# Patient Record
Sex: Male | Born: 1935 | Race: White | Hispanic: No | Marital: Married | Smoking: Never smoker
Health system: Southern US, Community
[De-identification: ages and names within clinical notes are randomized; demographics above are authoritative.]

## PROBLEM LIST (undated history)

## (undated) DIAGNOSIS — E785 Hyperlipidemia, unspecified: Secondary | ICD-10-CM

## (undated) DIAGNOSIS — G20A1 Parkinson's disease without dyskinesia, without mention of fluctuations: Secondary | ICD-10-CM

## (undated) DIAGNOSIS — W19XXXA Unspecified fall, initial encounter: Secondary | ICD-10-CM

## (undated) DIAGNOSIS — C189 Malignant neoplasm of colon, unspecified: Secondary | ICD-10-CM

## (undated) DIAGNOSIS — R5383 Other fatigue: Secondary | ICD-10-CM

## (undated) DIAGNOSIS — F329 Major depressive disorder, single episode, unspecified: Secondary | ICD-10-CM

## (undated) DIAGNOSIS — R739 Hyperglycemia, unspecified: Secondary | ICD-10-CM

## (undated) DIAGNOSIS — G20 Parkinson's disease: Secondary | ICD-10-CM

## (undated) DIAGNOSIS — F32A Depression, unspecified: Secondary | ICD-10-CM

## (undated) DIAGNOSIS — F1010 Alcohol abuse, uncomplicated: Secondary | ICD-10-CM

## (undated) DIAGNOSIS — F0390 Unspecified dementia without behavioral disturbance: Secondary | ICD-10-CM

## (undated) HISTORY — DX: Parkinson's disease: G20

## (undated) HISTORY — DX: Unspecified dementia, unspecified severity, without behavioral disturbance, psychotic disturbance, mood disturbance, and anxiety: F03.90

## (undated) HISTORY — DX: Major depressive disorder, single episode, unspecified: F32.9

## (undated) HISTORY — DX: Other fatigue: R53.83

## (undated) HISTORY — DX: Depression, unspecified: F32.A

## (undated) HISTORY — PX: SPINAL FUSION: SHX223

## (undated) HISTORY — DX: Hyperlipidemia, unspecified: E78.5

## (undated) HISTORY — DX: Malignant neoplasm of colon, unspecified: C18.9

## (undated) HISTORY — PX: COLECTOMY: SHX59

## (undated) HISTORY — DX: Parkinson's disease without dyskinesia, without mention of fluctuations: G20.A1

## (undated) HISTORY — DX: Alcohol abuse, uncomplicated: F10.10

---

## 1998-08-01 ENCOUNTER — Inpatient Hospital Stay (HOSPITAL_COMMUNITY): Admission: EM | Admit: 1998-08-01 | Discharge: 1998-08-10 | Payer: Self-pay | Admitting: Emergency Medicine

## 1999-08-17 ENCOUNTER — Ambulatory Visit (HOSPITAL_BASED_OUTPATIENT_CLINIC_OR_DEPARTMENT_OTHER): Admission: RE | Admit: 1999-08-17 | Discharge: 1999-08-17 | Payer: Self-pay | Admitting: Orthopedic Surgery

## 2000-04-23 ENCOUNTER — Other Ambulatory Visit: Admission: RE | Admit: 2000-04-23 | Discharge: 2000-04-23 | Payer: Self-pay | Admitting: Gastroenterology

## 2000-04-23 ENCOUNTER — Encounter (INDEPENDENT_AMBULATORY_CARE_PROVIDER_SITE_OTHER): Payer: Self-pay | Admitting: Specialist

## 2001-07-01 ENCOUNTER — Encounter (INDEPENDENT_AMBULATORY_CARE_PROVIDER_SITE_OTHER): Payer: Self-pay | Admitting: Specialist

## 2001-07-01 ENCOUNTER — Other Ambulatory Visit: Admission: RE | Admit: 2001-07-01 | Discharge: 2001-07-01 | Payer: Self-pay | Admitting: Gastroenterology

## 2005-05-16 ENCOUNTER — Ambulatory Visit: Payer: Self-pay | Admitting: Internal Medicine

## 2005-08-16 ENCOUNTER — Ambulatory Visit: Payer: Self-pay | Admitting: Internal Medicine

## 2005-09-01 ENCOUNTER — Ambulatory Visit: Payer: Self-pay | Admitting: Internal Medicine

## 2005-09-06 ENCOUNTER — Encounter: Payer: Self-pay | Admitting: Internal Medicine

## 2005-09-19 ENCOUNTER — Encounter: Payer: Self-pay | Admitting: Internal Medicine

## 2005-10-13 ENCOUNTER — Encounter: Admission: RE | Admit: 2005-10-13 | Discharge: 2005-10-13 | Payer: Self-pay | Admitting: Neurology

## 2005-11-29 ENCOUNTER — Inpatient Hospital Stay (HOSPITAL_COMMUNITY): Admission: RE | Admit: 2005-11-29 | Discharge: 2005-11-30 | Payer: Self-pay | Admitting: Neurosurgery

## 2006-12-13 ENCOUNTER — Ambulatory Visit: Payer: Self-pay | Admitting: Internal Medicine

## 2006-12-13 LAB — CONVERTED CEMR LAB
ALT: 20 units/L (ref 0–40)
AST: 23 units/L (ref 0–37)
BUN: 16 mg/dL (ref 6–23)
Calcium: 9.5 mg/dL (ref 8.4–10.5)
Chol/HDL Ratio, serum: 6.2
Cholesterol: 223 mg/dL (ref 0–200)
Creatinine, Ser: 0.9 mg/dL (ref 0.4–1.5)
Glucose, Bld: 96 mg/dL (ref 70–99)
HCT: 45.6 % (ref 39.0–52.0)
HDL: 35.8 mg/dL — ABNORMAL LOW (ref >39.0)
Hemoglobin: 15.4 g/dL (ref 13.0–17.0)
LDL DIRECT: 180.3 mg/dL
MCHC: 33.8 g/dL (ref 30.0–36.0)
MCV: 89 fL (ref 78.0–100.0)
PSA: 1.38 ng/mL (ref 0.10–4.00)
Platelets: 157 10*3/uL (ref 150–400)
Potassium: 3.9 meq/L (ref 3.5–5.1)
RBC: 5.13 M/uL (ref 4.22–5.81)
RDW: 14 % (ref 11.5–14.6)
TSH: 1.78 microintl units/mL (ref 0.35–5.50)
Triglyceride fasting, serum: 96 mg/dL (ref 0–149)
VLDL: 19 mg/dL (ref 0–40)
WBC: 5.8 10*3/uL (ref 4.5–10.5)

## 2007-02-08 ENCOUNTER — Ambulatory Visit: Payer: Self-pay | Admitting: Internal Medicine

## 2007-02-13 ENCOUNTER — Ambulatory Visit: Payer: Self-pay | Admitting: Internal Medicine

## 2007-02-13 LAB — CONVERTED CEMR LAB
ALT: 16 units/L (ref 0–40)
AST: 22 units/L (ref 0–37)
Cholesterol: 159 mg/dL (ref 0–200)
HDL: 37.2 mg/dL — ABNORMAL LOW (ref >39.0)
LDL Cholesterol: 107 mg/dL — ABNORMAL HIGH (ref 0–99)
Total CHOL/HDL Ratio: 4.3
Triglycerides: 74 mg/dL (ref 0–149)
VLDL: 15 mg/dL (ref 0–40)

## 2007-05-31 ENCOUNTER — Ambulatory Visit: Payer: Self-pay | Admitting: Internal Medicine

## 2007-05-31 DIAGNOSIS — R5381 Other malaise: Secondary | ICD-10-CM | POA: Insufficient documentation

## 2007-05-31 DIAGNOSIS — R5383 Other fatigue: Secondary | ICD-10-CM

## 2007-05-31 DIAGNOSIS — C189 Malignant neoplasm of colon, unspecified: Secondary | ICD-10-CM | POA: Insufficient documentation

## 2007-05-31 DIAGNOSIS — E785 Hyperlipidemia, unspecified: Secondary | ICD-10-CM

## 2007-06-03 ENCOUNTER — Encounter: Admission: RE | Admit: 2007-06-03 | Discharge: 2007-06-03 | Payer: Self-pay | Admitting: Internal Medicine

## 2007-06-03 ENCOUNTER — Ambulatory Visit: Payer: Self-pay | Admitting: Internal Medicine

## 2007-06-03 LAB — CONVERTED CEMR LAB
ALT: 16 units/L (ref 0–40)
AST: 23 units/L (ref 0–37)
BUN: 15 mg/dL (ref 6–23)
Basophils Absolute: 0 10*3/uL (ref 0.0–0.1)
Basophils Relative: 0.1 % (ref 0.0–1.0)
CO2: 27 meq/L (ref 19–32)
Calcium: 9.4 mg/dL (ref 8.4–10.5)
Chloride: 108 meq/L (ref 96–112)
Creatinine, Ser: 0.8 mg/dL (ref 0.4–1.5)
Eosinophils Absolute: 0 10*3/uL (ref 0.0–0.6)
Eosinophils Relative: 0.8 % (ref 0.0–5.0)
GFR calc Af Amer: 120 mL/min
GFR calc non Af Amer: 99 mL/min
Glucose, Bld: 117 mg/dL — ABNORMAL HIGH (ref 70–99)
HCT: 43.3 % (ref 39.0–52.0)
Hemoglobin: 14.9 g/dL (ref 13.0–17.0)
Lymphocytes Relative: 21.5 % (ref 12.0–46.0)
MCHC: 34.3 g/dL (ref 30.0–36.0)
MCV: 88.4 fL (ref 78.0–100.0)
Monocytes Absolute: 0.5 10*3/uL (ref 0.2–0.7)
Monocytes Relative: 7.9 % (ref 3.0–11.0)
Neutro Abs: 4.2 10*3/uL (ref 1.4–7.7)
Neutrophils Relative %: 69.7 % (ref 43.0–77.0)
Platelets: 163 10*3/uL (ref 150–400)
Potassium: 3.8 meq/L (ref 3.5–5.1)
RBC: 4.9 M/uL (ref 4.22–5.81)
RDW: 13.7 % (ref 11.5–14.6)
Sodium: 141 meq/L (ref 135–145)
WBC: 6 10*3/uL (ref 4.5–10.5)

## 2007-06-05 ENCOUNTER — Encounter: Payer: Self-pay | Admitting: Internal Medicine

## 2007-11-28 ENCOUNTER — Ambulatory Visit: Payer: Self-pay | Admitting: Internal Medicine

## 2008-02-23 ENCOUNTER — Encounter: Payer: Self-pay | Admitting: Internal Medicine

## 2008-03-10 ENCOUNTER — Ambulatory Visit: Payer: Self-pay | Admitting: Internal Medicine

## 2008-03-10 DIAGNOSIS — R269 Unspecified abnormalities of gait and mobility: Secondary | ICD-10-CM | POA: Insufficient documentation

## 2008-03-10 DIAGNOSIS — R413 Other amnesia: Secondary | ICD-10-CM | POA: Insufficient documentation

## 2008-03-19 LAB — CONVERTED CEMR LAB
ALT: 18 units/L (ref 0–53)
AST: 20 units/L (ref 0–37)
BUN: 17 mg/dL (ref 6–23)
Basophils Absolute: 0 10*3/uL (ref 0.0–0.1)
Basophils Relative: 0.3 % (ref 0.0–1.0)
CO2: 31 meq/L (ref 19–32)
Calcium: 9.7 mg/dL (ref 8.4–10.5)
Chloride: 106 meq/L (ref 96–112)
Cholesterol: 133 mg/dL (ref 0–200)
Creatinine, Ser: 0.9 mg/dL (ref 0.4–1.5)
Eosinophils Absolute: 0.1 10*3/uL (ref 0.0–0.6)
Eosinophils Relative: 0.9 % (ref 0.0–5.0)
GFR calc Af Amer: 105 mL/min
GFR calc non Af Amer: 87 mL/min
Glucose, Bld: 94 mg/dL (ref 70–99)
HCT: 43.7 % (ref 39.0–52.0)
HDL: 38.5 mg/dL — ABNORMAL LOW (ref >39.0)
Hemoglobin: 14.8 g/dL (ref 13.0–17.0)
Iron: 54 ug/dL (ref 42–165)
LDL Cholesterol: 78 mg/dL (ref 0–99)
Lymphocytes Relative: 15.3 % (ref 12.0–46.0)
MCHC: 33.9 g/dL (ref 30.0–36.0)
MCV: 88.1 fL (ref 78.0–100.0)
Monocytes Absolute: 0.7 10*3/uL (ref 0.2–0.7)
Monocytes Relative: 9.2 % (ref 3.0–11.0)
Neutro Abs: 5.3 10*3/uL (ref 1.4–7.7)
Neutrophils Relative %: 74.3 % (ref 43.0–77.0)
Platelets: 141 10*3/uL — ABNORMAL LOW (ref 150–400)
Potassium: 4.1 meq/L (ref 3.5–5.1)
RBC: 4.96 M/uL (ref 4.22–5.81)
RDW: 14.1 % (ref 11.5–14.6)
Saturation Ratios: 18 % — ABNORMAL LOW (ref 20.0–50.0)
Sodium: 141 meq/L (ref 135–145)
TSH: 1.81 microintl units/mL (ref 0.35–5.50)
Total CHOL/HDL Ratio: 3.5
Total CK: 84 units/L (ref 7–195)
Transferrin: 214.2 mg/dL (ref >=212.0)
Triglycerides: 82 mg/dL (ref 0–149)
VLDL: 16 mg/dL (ref 0–40)
WBC: 7.2 10*3/uL (ref 4.5–10.5)

## 2008-03-23 ENCOUNTER — Encounter: Admission: RE | Admit: 2008-03-23 | Discharge: 2008-06-02 | Payer: Self-pay | Admitting: Internal Medicine

## 2008-03-23 ENCOUNTER — Encounter: Payer: Self-pay | Admitting: Internal Medicine

## 2008-04-24 ENCOUNTER — Encounter: Payer: Self-pay | Admitting: Internal Medicine

## 2008-05-08 ENCOUNTER — Ambulatory Visit: Payer: Self-pay | Admitting: Internal Medicine

## 2008-06-03 ENCOUNTER — Encounter: Payer: Self-pay | Admitting: Internal Medicine

## 2008-07-06 ENCOUNTER — Ambulatory Visit: Payer: Self-pay | Admitting: Internal Medicine

## 2008-07-06 LAB — CONVERTED CEMR LAB: Vit D, 1,25-Dihydroxy: 22 — ABNORMAL LOW (ref 30–89)

## 2008-07-09 ENCOUNTER — Telehealth (INDEPENDENT_AMBULATORY_CARE_PROVIDER_SITE_OTHER): Payer: Self-pay | Admitting: *Deleted

## 2008-07-09 LAB — CONVERTED CEMR LAB
Folate: >20 ng/mL
Sed Rate: 11 mm/hr (ref 0–16)
Total CK: 71 units/L (ref 7–195)
Vitamin B-12: 443 pg/mL (ref 211–911)

## 2008-07-10 ENCOUNTER — Ambulatory Visit: Payer: Self-pay | Admitting: Internal Medicine

## 2008-07-13 ENCOUNTER — Encounter (INDEPENDENT_AMBULATORY_CARE_PROVIDER_SITE_OTHER): Payer: Self-pay | Admitting: *Deleted

## 2008-07-16 ENCOUNTER — Ambulatory Visit: Payer: Self-pay

## 2008-07-16 ENCOUNTER — Encounter: Payer: Self-pay | Admitting: Internal Medicine

## 2008-07-28 ENCOUNTER — Telehealth (INDEPENDENT_AMBULATORY_CARE_PROVIDER_SITE_OTHER): Payer: Self-pay | Admitting: *Deleted

## 2008-08-10 ENCOUNTER — Encounter (INDEPENDENT_AMBULATORY_CARE_PROVIDER_SITE_OTHER): Payer: Self-pay | Admitting: *Deleted

## 2008-08-18 ENCOUNTER — Encounter: Payer: Self-pay | Admitting: Internal Medicine

## 2008-08-21 ENCOUNTER — Encounter: Payer: Self-pay | Admitting: Internal Medicine

## 2008-09-02 ENCOUNTER — Encounter: Payer: Self-pay | Admitting: Internal Medicine

## 2009-03-12 ENCOUNTER — Ambulatory Visit: Payer: Self-pay | Admitting: Internal Medicine

## 2009-03-12 DIAGNOSIS — I359 Nonrheumatic aortic valve disorder, unspecified: Secondary | ICD-10-CM | POA: Insufficient documentation

## 2009-03-16 ENCOUNTER — Encounter (INDEPENDENT_AMBULATORY_CARE_PROVIDER_SITE_OTHER): Payer: Self-pay | Admitting: *Deleted

## 2009-03-16 LAB — CONVERTED CEMR LAB
ALT: 16 units/L (ref 0–53)
AST: 22 units/L (ref 0–37)
Amylase: 58 units/L (ref 27–131)
BUN: 17 mg/dL (ref 6–23)
Basophils Absolute: 0 10*3/uL (ref 0.0–0.1)
Basophils Relative: 0.2 % (ref 0.0–3.0)
CO2: 29 meq/L (ref 19–32)
Calcium: 9.2 mg/dL (ref 8.4–10.5)
Chloride: 104 meq/L (ref 96–112)
Creatinine, Ser: 0.8 mg/dL (ref 0.4–1.5)
Eosinophils Absolute: 0 10*3/uL (ref 0.0–0.7)
Eosinophils Relative: 0.8 % (ref 0.0–5.0)
GFR calc non Af Amer: 98.81 mL/min (ref >60)
Glucose, Bld: 94 mg/dL (ref 70–99)
HCT: 42.1 % (ref 39.0–52.0)
Hemoglobin: 14.8 g/dL (ref 13.0–17.0)
Lipase: 23 units/L (ref 11.0–59.0)
Lymphocytes Relative: 15.6 % (ref 12.0–46.0)
Lymphs Abs: 1 10*3/uL (ref 0.7–4.0)
MCHC: 35 g/dL (ref 30.0–36.0)
MCV: 88.1 fL (ref 78.0–100.0)
Monocytes Absolute: 0.6 10*3/uL (ref 0.1–1.0)
Monocytes Relative: 9.5 % (ref 3.0–12.0)
Neutro Abs: 4.6 10*3/uL (ref 1.4–7.7)
Neutrophils Relative %: 73.9 % (ref 43.0–77.0)
Platelets: 132 10*3/uL — ABNORMAL LOW (ref 150.0–400.0)
Potassium: 4 meq/L (ref 3.5–5.1)
RBC: 4.78 M/uL (ref 4.22–5.81)
RDW: 13.9 % (ref 11.5–14.6)
Sodium: 139 meq/L (ref 135–145)
WBC: 6.2 10*3/uL (ref 4.5–10.5)

## 2009-03-31 ENCOUNTER — Encounter (INDEPENDENT_AMBULATORY_CARE_PROVIDER_SITE_OTHER): Payer: Self-pay | Admitting: *Deleted

## 2009-06-22 ENCOUNTER — Telehealth: Payer: Self-pay | Admitting: Internal Medicine

## 2009-06-22 ENCOUNTER — Encounter (INDEPENDENT_AMBULATORY_CARE_PROVIDER_SITE_OTHER): Payer: Self-pay | Admitting: *Deleted

## 2009-06-22 ENCOUNTER — Ambulatory Visit: Payer: Self-pay | Admitting: Internal Medicine

## 2009-06-25 LAB — CONVERTED CEMR LAB
Basophils Absolute: 0 10*3/uL (ref 0.0–0.1)
Basophils Relative: 0.2 % (ref 0.0–3.0)
Eosinophils Absolute: 0.1 10*3/uL (ref 0.0–0.7)
Eosinophils Relative: 0.8 % (ref 0.0–5.0)
HCT: 43.8 % (ref 39.0–52.0)
Hemoglobin: 15 g/dL (ref 13.0–17.0)
Lymphocytes Relative: 14.7 % (ref 12.0–46.0)
Lymphs Abs: 1 10*3/uL (ref 0.7–4.0)
MCHC: 34.3 g/dL (ref 30.0–36.0)
MCV: 89.7 fL (ref 78.0–100.0)
Monocytes Absolute: 0.6 10*3/uL (ref 0.1–1.0)
Monocytes Relative: 9.1 % (ref 3.0–12.0)
Neutro Abs: 4.8 10*3/uL (ref 1.4–7.7)
Neutrophils Relative %: 75.2 % (ref 43.0–77.0)
Platelets: 134 10*3/uL — ABNORMAL LOW (ref 150.0–400.0)
RBC: 4.88 M/uL (ref 4.22–5.81)
RDW: 13.9 % (ref 11.5–14.6)
TSH: 1.23 microintl units/mL (ref 0.35–5.50)
WBC: 6.5 10*3/uL (ref 4.5–10.5)

## 2009-07-05 ENCOUNTER — Encounter (INDEPENDENT_AMBULATORY_CARE_PROVIDER_SITE_OTHER): Payer: Self-pay | Admitting: *Deleted

## 2009-07-05 ENCOUNTER — Telehealth: Payer: Self-pay | Admitting: Internal Medicine

## 2009-10-21 ENCOUNTER — Ambulatory Visit: Payer: Self-pay | Admitting: Internal Medicine

## 2009-10-22 LAB — CONVERTED CEMR LAB
ALT: 8 units/L (ref 0–53)
AST: 15 units/L (ref 0–37)
Cholesterol: 146 mg/dL (ref 0–200)
HDL: 40.4 mg/dL (ref >39.00)
Hemoglobin: 14.6 g/dL (ref 13.0–17.0)
LDL Cholesterol: 88 mg/dL (ref 0–99)
PSA: 1.11 ng/mL (ref 0.10–4.00)
Total CHOL/HDL Ratio: 4
Triglycerides: 90 mg/dL (ref 0.0–149.0)
VLDL: 18 mg/dL (ref 0.0–40.0)

## 2010-01-24 ENCOUNTER — Encounter: Payer: Self-pay | Admitting: Internal Medicine

## 2010-02-21 ENCOUNTER — Encounter: Payer: Self-pay | Admitting: Internal Medicine

## 2010-02-24 ENCOUNTER — Telehealth: Payer: Self-pay | Admitting: Gastroenterology

## 2010-03-21 ENCOUNTER — Encounter: Payer: Self-pay | Admitting: Internal Medicine

## 2010-03-24 ENCOUNTER — Ambulatory Visit: Payer: Self-pay | Admitting: Internal Medicine

## 2010-03-24 DIAGNOSIS — F329 Major depressive disorder, single episode, unspecified: Secondary | ICD-10-CM | POA: Insufficient documentation

## 2010-03-24 DIAGNOSIS — G20A1 Parkinson's disease without dyskinesia, without mention of fluctuations: Secondary | ICD-10-CM | POA: Insufficient documentation

## 2010-03-24 DIAGNOSIS — G20 Parkinson's disease: Secondary | ICD-10-CM | POA: Insufficient documentation

## 2010-03-25 ENCOUNTER — Encounter (INDEPENDENT_AMBULATORY_CARE_PROVIDER_SITE_OTHER): Payer: Self-pay | Admitting: *Deleted

## 2010-03-25 HISTORY — PX: SHOULDER ARTHROSCOPY: SHX128

## 2010-03-26 LAB — CONVERTED CEMR LAB
ALT: 13 units/L (ref 0–53)
AST: 21 units/L (ref 0–37)
Albumin: 4.3 g/dL (ref 3.5–5.2)
Alkaline Phosphatase: 74 units/L (ref 39–117)
BUN: 11 mg/dL (ref 6–23)
Bilirubin, Direct: 0.2 mg/dL (ref 0.0–0.3)
CO2: 28 meq/L (ref 19–32)
Calcium: 9.4 mg/dL (ref 8.4–10.5)
Chloride: 98 meq/L (ref 96–112)
Creatinine, Ser: 0.9 mg/dL (ref 0.4–1.5)
GFR calc non Af Amer: 86.03 mL/min (ref >60)
Glucose, Bld: 99 mg/dL (ref 70–99)
Potassium: 4.5 meq/L (ref 3.5–5.1)
Sodium: 136 meq/L (ref 135–145)
Total Bilirubin: 1.3 mg/dL — ABNORMAL HIGH (ref 0.3–1.2)
Total Protein: 7.2 g/dL (ref 6.0–8.3)

## 2010-03-28 ENCOUNTER — Encounter: Payer: Self-pay | Admitting: Internal Medicine

## 2010-03-30 ENCOUNTER — Telehealth (INDEPENDENT_AMBULATORY_CARE_PROVIDER_SITE_OTHER): Payer: Self-pay | Admitting: *Deleted

## 2010-04-01 ENCOUNTER — Ambulatory Visit (HOSPITAL_COMMUNITY): Admission: RE | Admit: 2010-04-01 | Discharge: 2010-04-01 | Payer: Self-pay | Admitting: Internal Medicine

## 2010-04-01 ENCOUNTER — Ambulatory Visit: Payer: Self-pay | Admitting: Internal Medicine

## 2010-04-01 ENCOUNTER — Encounter: Payer: Self-pay | Admitting: Internal Medicine

## 2010-04-01 ENCOUNTER — Ambulatory Visit: Payer: Self-pay

## 2010-04-04 ENCOUNTER — Telehealth (INDEPENDENT_AMBULATORY_CARE_PROVIDER_SITE_OTHER): Payer: Self-pay | Admitting: *Deleted

## 2010-04-27 ENCOUNTER — Encounter: Admission: RE | Admit: 2010-04-27 | Discharge: 2010-04-27 | Payer: Self-pay | Admitting: Orthopedic Surgery

## 2010-05-02 ENCOUNTER — Ambulatory Visit (HOSPITAL_BASED_OUTPATIENT_CLINIC_OR_DEPARTMENT_OTHER): Admission: RE | Admit: 2010-05-02 | Discharge: 2010-05-03 | Payer: Self-pay | Admitting: Orthopedic Surgery

## 2010-07-14 ENCOUNTER — Emergency Department (HOSPITAL_COMMUNITY): Admission: EM | Admit: 2010-07-14 | Discharge: 2010-07-14 | Payer: Self-pay | Admitting: Emergency Medicine

## 2010-07-27 ENCOUNTER — Encounter: Admission: RE | Admit: 2010-07-27 | Discharge: 2010-10-25 | Payer: Self-pay | Admitting: Orthopedic Surgery

## 2010-09-26 ENCOUNTER — Ambulatory Visit: Payer: Self-pay | Admitting: Internal Medicine

## 2010-09-26 DIAGNOSIS — R609 Edema, unspecified: Secondary | ICD-10-CM | POA: Insufficient documentation

## 2010-09-28 LAB — CONVERTED CEMR LAB
Basophils Absolute: 0 10*3/uL (ref 0.0–0.1)
Basophils Relative: 0.3 % (ref 0.0–3.0)
Cholesterol: 147 mg/dL (ref 0–200)
Eosinophils Absolute: 0.1 10*3/uL (ref 0.0–0.7)
Eosinophils Relative: 1 % (ref 0.0–5.0)
HCT: 41.2 % (ref 39.0–52.0)
HDL: 47.9 mg/dL (ref >39.00)
Hemoglobin: 14.1 g/dL (ref 13.0–17.0)
LDL Cholesterol: 85 mg/dL (ref 0–99)
Lymphocytes Relative: 18.7 % (ref 12.0–46.0)
Lymphs Abs: 1.3 10*3/uL (ref 0.7–4.0)
MCHC: 34.3 g/dL (ref 30.0–36.0)
MCV: 88.8 fL (ref 78.0–100.0)
Monocytes Absolute: 0.7 10*3/uL (ref 0.1–1.0)
Monocytes Relative: 10.7 % (ref 3.0–12.0)
Neutro Abs: 4.7 10*3/uL (ref 1.4–7.7)
Neutrophils Relative %: 69.3 % (ref 43.0–77.0)
Platelets: 146 10*3/uL — ABNORMAL LOW (ref 150.0–400.0)
RBC: 4.63 M/uL (ref 4.22–5.81)
RDW: 15.7 % — ABNORMAL HIGH (ref 11.5–14.6)
Total CHOL/HDL Ratio: 3
Triglycerides: 70 mg/dL (ref 0.0–149.0)
VLDL: 14 mg/dL (ref 0.0–40.0)
WBC: 6.8 10*3/uL (ref 4.5–10.5)

## 2010-10-10 ENCOUNTER — Encounter (INDEPENDENT_AMBULATORY_CARE_PROVIDER_SITE_OTHER): Payer: Self-pay | Admitting: *Deleted

## 2010-10-27 ENCOUNTER — Encounter (INDEPENDENT_AMBULATORY_CARE_PROVIDER_SITE_OTHER): Payer: Self-pay | Admitting: *Deleted

## 2010-10-31 ENCOUNTER — Ambulatory Visit: Payer: Self-pay | Admitting: Gastroenterology

## 2010-11-21 ENCOUNTER — Ambulatory Visit: Payer: Self-pay | Admitting: Gastroenterology

## 2011-01-21 LAB — CONVERTED CEMR LAB: Glucose, Bld: 89 mg/dL

## 2011-01-22 LAB — CONVERTED CEMR LAB
Glucose, Bld: 95 mg/dL
Hemoglobin: 14.6 g/dL

## 2011-01-24 NOTE — Assessment & Plan Note (Signed)
Summary: 6 month roa//lch   Vital Signs:  Patient profile:   77 year old male Weight:      230 pounds Pulse rate:   77 / minute Pulse rhythm:   regular BP sitting:   126 / 82  (left arm) Cuff size:   large  Vitals Entered By: Army Fossa CMA (September 26, 2010 8:26 AM) CC: Pt here for 6 month f/u- fasting Comments flu shot [facility]- [facility] [address]   History of Present Illness: ROV s/p L shoulder arthroscopy aprox 4-11, s/p  PT  Hx of ADENOCARCINOMA, COLON  --due for a colonoscopy, see last office visit. planning to do it soon, had other things going on    HYPERLIPIDEMIA  --due for labs, good medication compliance   Depression--started on medication by neurology, better? patient not sure, will see neurology soon   history of aortic valve stenosis, last echo 4-11, mild aortic stenosis  Current Medications (verified): 1)  Zocor 20 Mg Tabs (Simvastatin) .... Take 1 Tablet By Mouth Once A Day 2)  Aspirin 81 Mg Tbec (Aspirin) .Marland Kitchen.. 1 By Mouth Qd 3)  Caltrate 600+d Plus 600-400 Mg-Unit  Tabs (Calcium Carbonate-Vit D-Min) .Marland Kitchen.. 1 By Mouth Qd 4)  Vitamin C 500 Mg  Tabs (Ascorbic Acid) .Marland Kitchen.. 1 By Mouth Qd 5)  Centrum Silver   Tabs (Multiple Vitamins-Minerals) 6)  Fish Oil Concentrate 1000 Mg  Caps (Omega-3 Fatty Acids) 7)  Stalevo 50 12.5-50-200 Mg Tabs (Carbidopa-Levodopa-Entacapone) .... Qid (Dr Quentin Mulling) 8)  Lexapro 10 Mg Tabs (Escitalopram Oxalate) .... Per Neurology, ?exact Dose 9)  Exelon 4.6 Mg/24hr Pt24 (Rivastigmine) .... (Dr Quentin Mulling)  Allergies (verified): No Known Drug Allergies  Past History:  Past Medical History: Hyperlipidemia Hx of ADENOCARCINOMA, COLON   FATIGUE, CHRONIC   HYPERLIPIDEMIA   PARKINSON's  Depression  Past Surgical History: Spinal fusion, back surgery for spinal stenosis s/p L shoulder arthroscopy aprox 4-11  Social History: Reviewed history from 03/10/2008 and no changes required. Never Smoked Alcohol use-no Drug use-yes lives w/  girlfriend-partner ADL independent so far still drives  Review of Systems CV:  Denies chest pain or discomfort and swelling of feet. Resp:  Denies cough and shortness of breath. GI:  Denies bloody stools, diarrhea, nausea, and vomiting.  Physical Exam  General:  alert and well-developed.   Lungs:  normal respiratory effort, no intercostal retractions, no accessory muscle use, and normal breath sounds.   Heart:  normal rate, regular rhythm, and a II/VI syst murmur  Extremities:  +/+++ B pitting symetric  edema Psych:  Oriented X3.  seems to be more interactive, not anxious,  affect somewhat flat   Impression & Recommendations:  Problem # 1:  DEPRESSION (ICD-311) on Lexapro for several months, improved? Recommend to discuss with neurology  His updated medication list for this problem includes:    Lexapro 10 Mg Tabs (Escitalopram oxalate) .Marland Kitchen... Per neurology, ?exact dose  Problem # 2:  AORTIC VALVE DISORDERS (ICD-424.1) last echo 4-11, mild aortic stenosis  His updated medication list for this problem includes:    Aspirin 81 Mg Tbec (Aspirin) .Marland Kitchen... 1 by mouth qd  Problem # 3:  EDEMA (ICD-782.3) lower extremity edema Denies shortness of breath, orthopnea Not on calcium channel blockers plan: Observation, consider diuretics, recommend to elevate the legs as much as he can  Problem # 4:  PARKINSON'S DISEASE (ICD-332.0) per neurology  Problem # 5:  Hx of ADENOCARCINOMA, COLON (ICD-153.9)  due for a colonoscopy, encouraged to schedule w. Dr Arlyce Dice  Orders: TLB-CBC Platelet - w/Differential (85025-CBCD)  Problem # 6:  HYPERLIPIDEMIA (ICD-272.4) due for labs  His updated medication list for this problem includes:    Zocor 20 Mg Tabs (Simvastatin) .Marland Kitchen... Take 1 tablet by mouth once a day    Labs Reviewed: SGOT: 21 (03/24/2010)   SGPT: 13 (03/24/2010)   HDL:40.40 (10/21/2009), 38.5 (03/10/2008)  LDL:88 (10/21/2009), 78 (03/10/2008)  Chol:146 (10/21/2009), 133  (03/10/2008)  Trig:90.0 (10/21/2009), 82 (03/10/2008)  Orders: Venipuncture (16109) TLB-Lipid Panel (80061-LIPID)  Complete Medication List: 1)  Zocor 20 Mg Tabs (Simvastatin) .... Take 1 tablet by mouth once a day 2)  Aspirin 81 Mg Tbec (Aspirin) .Marland Kitchen.. 1 by mouth qd 3)  Caltrate 600+d Plus 600-400 Mg-unit Tabs (Calcium carbonate-vit d-min) .Marland Kitchen.. 1 by mouth qd 4)  Vitamin C 500 Mg Tabs (Ascorbic acid) .Marland Kitchen.. 1 by mouth qd 5)  Centrum Silver Tabs (Multiple vitamins-minerals) 6)  Fish Oil Concentrate 1000 Mg Caps (Omega-3 fatty acids) 7)  Stalevo 50 12.5-50-200 Mg Tabs (Carbidopa-levodopa-entacapone) .... Qid (dr Quentin Mulling) 8)  Lexapro 10 Mg Tabs (Escitalopram oxalate) .... Per neurology, ?exact dose 9)  Exelon 4.6 Mg/24hr Pt24 (Rivastigmine) .... (dr Quentin Mulling)  Other Orders: Flu Vaccine 75yrs + MEDICARE PATIENTS [phone]) Administration Flu vaccine - MCR (U9811)  Patient Instructions: 1)  Please schedule a follow-up appointment in 4 to 5 months .  Flu Vaccine Consent Questions     Do you have a history of severe allergic reactions to this vaccine? no    Any prior history of allergic reactions to egg and/or gelatin? no    Do you have a sensitivity to the preservative Thimersol? no    Do you have a past history of Guillan-Barre Syndrome? no    Do you currently have an acute febrile illness? no    Have you ever had a severe reaction to latex? no    Vaccine information given and explained to patient? yes    Are you currently pregnant? no    Lot Number:[id number]   Exp Date:06/24/2011   Site Given  right  Deltoid IM   .lbmedflu

## 2011-01-24 NOTE — Assessment & Plan Note (Signed)
Summary: EST MEDICARE YEARLY//PH   Vital Signs:  Patient profile:   77 year old male Weight:      215.6 pounds Pulse rate:   80 / minute BP sitting:   124 / 80  Vitals Entered By: Shary Decamp (March 24, 2010 9:24 AM) CC: yearly - fasting   History of Present Illness: here w/ his partner Hyperlipidemia-- good medication compliance , no myalgias  parkinson's-- per neuro, on stalevo depression--- on lexapro , symptoms well controlled  yearly checkup, chart reviewed recent fall a few weeks ago, having shoulder pain, undergoing physical therapy, to have MRI tomorrow  Current Medications (verified): 1)  Zocor 20 Mg Tabs (Simvastatin) .... Take 1 Tablet By Mouth Once A Day 2)  Aspirin 81 Mg Tbec (Aspirin) .Marland Kitchen.. 1 By Mouth Qd 3)  Caltrate 600+d Plus 600-400 Mg-Unit  Tabs (Calcium Carbonate-Vit D-Min) .Marland Kitchen.. 1 By Mouth Qd 4)  Vitamin C 500 Mg  Tabs (Ascorbic Acid) .Marland Kitchen.. 1 By Mouth Qd 5)  Centrum Silver   Tabs (Multiple Vitamins-Minerals) 6)  Fish Oil Concentrate 1000 Mg  Caps (Omega-3 Fatty Acids) 7)  Stalevo 50 12.5-50-200 Mg Tabs (Carbidopa-Levodopa-Entacapone) .... Qid (Dr Quentin Mulling) 8)  Hydrocortisone 2.5 % Lotn (Hydrocortisone) .... Apply Two Times A Day As Needed Poison Ivy 9)  Lexapro 10 Mg Tabs (Escitalopram Oxalate) .... Per Neurology, ?exact Dose 10)  Exelon 4.6 Mg/24hr Pt24 (Rivastigmine) .... (Dr Quentin Mulling)  Allergies (verified): No Known Drug Allergies  Past History:  Past Medical History: Hyperlipidemia Hx of ADENOCARCINOMA, COLON   FATIGUE, CHRONIC   HYPERLIPIDEMIA   PARKINSON's  Depression  Past Surgical History: Reviewed history from 03/10/2008 and no changes required. Spinal fusion, back surgery for spinal stenosis  Family History: Reviewed history from 03/10/2008 and no changes required. liver Ca - brother DM - no CAD - no  Social History: Reviewed history from 03/10/2008 and no changes required. Never Smoked Alcohol use-no Drug use-yes lives w/  girlfriend-partner ADL independent so far still drives  Review of Systems General:  still fatigue . CV:  Denies chest pain or discomfort, palpitations, and swelling of feet. Resp:  Denies cough and shortness of breath. GI:  Denies bloody stools, diarrhea, nausea, and vomiting. GU:  Denies hematuria, urinary frequency, and urinary hesitancy.  Physical Exam  General:  alert and well-developed.   Lungs:  normal respiratory effort, no intercostal retractions, no accessory muscle use, and normal breath sounds.   Heart:  normal rate, regular rhythm, and a II/VI syst murmur  Rectal:  No external abnormalities noted. Normal sphincter tone. No rectal masses or tenderness. Hemoccult negative Prostate:  Prostate gland firm and smooth, no enlargement, nodularity, tenderness, mass, asymmetry or induration. Extremities:  no edema Psych:  Oriented X3.  seems to be more interactive, not depressed, not anxious   Impression & Recommendations:  Problem # 1:  HYPERLIPIDEMIA (ICD-272.4) good control with Zocor His updated medication list for this problem includes:    Zocor 20 Mg Tabs (Simvastatin) .Marland Kitchen... Take 1 tablet by mouth once a day  Orders: Venipuncture (16109) TLB-Hepatic/Liver Function Pnl (80076-HEPATIC)  Labs Reviewed: SGOT: 15 (10/21/2009)   SGPT: 8 (10/21/2009)   HDL:40.40 (10/21/2009), 38.5 (03/10/2008)  LDL:88 (10/21/2009), 78 (03/10/2008)  Chol:146 (10/21/2009), 133 (03/10/2008)  Trig:90.0 (10/21/2009), 82 (03/10/2008)  Problem # 2:  HEALTH SCREENING (ICD-V70.0) Td 09 pneumonia shot 2007 printed material provided regards shingles immunization DRE today normal PSA:  1.11 (10/21/2009)       Problem # 3:  Hx of ADENOCARCINOMA, COLON (ICD-153.9) due  for a colonoscopy, patient and his partner aware, they plan to go ahead with that once his shoulder pain resolves, see HPI Colonoscopy:  Diverticulosis (08/03/2004)  Problem # 4:  PARKINSON'S DISEASE (ICD-332.0) sees neurology, was  diagnosed with Parkinson disease and depression currently on Stalevo, Lexapro in general he feels well, compared  to previous years he actually looks better, more interactive, facial expression less rigid.  Problem # 5:  DEPRESSION (ICD-311) see #4 His updated medication list for this problem includes:    Lexapro 10 Mg Tabs (Escitalopram oxalate) .Marland Kitchen... Per neurology, ?exact dose  Problem # 6:  FATIGUE, CHRONIC (ICD-780.79) in retrospective, chronic fatigue was probably due to Parkinson's  Problem # 7:  MEMORY LOSS (ICD-780.93) again he is follow up my neurology, he does have issue with memory problems  Problem # 8:  AORTIC VALVE DISORDERS (ICD-424.1) history of Ao stenosis, last echo 07/2008  recommend to repeat the echo  His updated medication list for this problem includes:    Aspirin 81 Mg Tbec (Aspirin) .Marland Kitchen... 1 by mouth qd  Orders: TLB-BMP (Basic Metabolic Panel-BMET) (80048-METABOL)  Complete Medication List: 1)  Zocor 20 Mg Tabs (Simvastatin) .... Take 1 tablet by mouth once a day 2)  Aspirin 81 Mg Tbec (Aspirin) .Marland Kitchen.. 1 by mouth qd 3)  Caltrate 600+d Plus 600-400 Mg-unit Tabs (Calcium carbonate-vit d-min) .Marland Kitchen.. 1 by mouth qd 4)  Vitamin C 500 Mg Tabs (Ascorbic acid) .Marland Kitchen.. 1 by mouth qd 5)  Centrum Silver Tabs (Multiple vitamins-minerals) 6)  Fish Oil Concentrate 1000 Mg Caps (Omega-3 fatty acids) 7)  Stalevo 50 12.5-50-200 Mg Tabs (Carbidopa-levodopa-entacapone) .... Qid (dr Quentin Mulling) 8)  Hydrocortisone 2.5 % Lotn (Hydrocortisone) .... Apply two times a day as needed poison ivy 9)  Lexapro 10 Mg Tabs (Escitalopram oxalate) .... Per neurology, ?exact dose 10)  Exelon 4.6 Mg/24hr Pt24 (Rivastigmine) .... (dr Quentin Mulling)  Other Orders: Cardiology Referral (Cardiology)  Patient Instructions: 1)  Please schedule a follow-up appointment in 6 months .    Preventive Care Screening  Prior Values:    PSA:  1.11 (10/21/2009)    Colonoscopy:  Diverticulosis (08/03/2004)    Last  Tetanus Booster:  Td (03/10/2008)    Last Flu Shot:  Fluvax 3+ (10/21/2009)    Last Pneumovax:  Pneumovax (12/13/2006)    Risk Factors: Tobacco use:  never Drug use:  yes Alcohol use:  no  Colonoscopy History:    Date of Last Colonoscopy:  08/03/2004

## 2011-01-24 NOTE — Letter (Signed)
Summary: [facility]  [facility]   Imported By: Lanelle Bal 04/06/2010 09:22:50  _____________________________________________________________________  External Attachment:    Type:   Image     Comment:   External Document

## 2011-01-24 NOTE — Letter (Signed)
Summary: Moviprep Instructions  [facility]  [address]   [place], [place] [address]   Phone: [phone]  Fax: [phone]       Dale Wong    January 06, 1927    MRN: [id number]        Procedure Day Dorna Bloom: Monday, 11-21-10     Arrival Time: 10:00 a.m.     Procedure Time: 11:00 a.m.     Location of Procedure:                    x   [facility] (4th Floor)                        PREPARATION FOR COLONOSCOPY WITH MOVIPREP   Starting 5 days prior to your procedure 5:00 a.m. do not eat nuts, seeds, popcorn, corn, beans, peas,  salads, or any raw vegetables.  Do not take any fiber supplements (e.g. Metamucil, Citrucel, and Benefiber).  THE DAY BEFORE YOUR PROCEDURE         DATE: 11-20-10   DAY: Sunday  1.  Drink clear liquids the entire day-NO SOLID FOOD  2.  Do not drink anything colored red or purple.  Avoid juices with pulp.  No orange juice.  3.  Drink at least 64 oz. (8 glasses) of fluid/clear liquids during the day to prevent dehydration and help the prep work efficiently.  CLEAR LIQUIDS INCLUDE: Water Jello Ice Popsicles Tea (sugar ok, no milk/cream) Powdered fruit flavored drinks Coffee (sugar ok, no milk/cream) Gatorade Juice: apple, white grape, white cranberry  Lemonade Clear bullion, consomm, broth Carbonated beverages (any kind) Strained chicken noodle soup Hard Candy                             4.  In the morning, mix first dose of MoviPrep solution:    Empty 1 Pouch A and 1 Pouch B into the disposable container    Add lukewarm drinking water to the top line of the container. Mix to dissolve    Refrigerate (mixed solution should be used within 24 hrs)  5.  Begin drinking the prep at 5:00 p.m. The MoviPrep container is divided by 4 marks.   Every 15 minutes drink the solution down to the next mark (approximately 8 oz) until the full liter is complete.   6.  Follow completed prep with 16 oz of clear liquid of your choice  (Nothing red or purple).  Continue to drink clear liquids until bedtime.  7.  Before going to bed, mix second dose of MoviPrep solution:    Empty 1 Pouch A and 1 Pouch B into the disposable container    Add lukewarm drinking water to the top line of the container. Mix to dissolve    Refrigerate  THE DAY OF YOUR PROCEDURE      DATE:  11-21-10  DAY: Monday  Beginning at 6:00 a.m. (5 hours before procedure):         1. Every 15 minutes, drink the solution down to the next mark (approx 8 oz) until the full liter is complete.  2. Follow completed prep with 16 oz. of clear liquid of your choice.    3. You may drink clear liquids until 9:00 a.m.  (2 HOURS BEFORE PROCEDURE).   MEDICATION INSTRUCTIONS  Unless otherwise instructed, you should take regular prescription medications with a small sip of water   as early  as possible the morning of your procedure.          OTHER INSTRUCTIONS  You will need a responsible adult at least 75 years of age to accompany you and drive you home.   This person must remain in the waiting room during your procedure.  Wear loose fitting clothing that is easily removed.  Leave jewelry and other valuables at home.  However, you may wish to bring a book to read or  an iPod/MP3 player to listen to music as you wait for your procedure to start.  Remove all body piercing jewelry and leave at home.  Total time from sign-in until discharge is approximately 2-3 hours.  You should go home directly after your procedure and rest.  You can resume normal activities the  day after your procedure.  The day of your procedure you should not:   Drive   Make legal decisions   Operate machinery   Drink alcohol   Return to work  You will receive specific instructions about eating, activities and medications before you leave.    The above instructions have been reviewed and explained to me by   Ezra Sites RN  October 31, 2010 1:58 PM    I fully  understand and can verbalize these instructions _____________________________ Date _________

## 2011-01-24 NOTE — Progress Notes (Signed)
Summary: surgical clearance, waiting on echo results  Phone Note From Other Clinic Call back at [facility] Phone [phone] Call back at fax # [phone]   Caller: [facility] Details for Reason: Needs medical clearance for shoulder surgery.  Need to fax clearance letter, last office notes, EKG, & labs. Summary of Call: Patient had cpx, labs 03/24/10 last ekg 2008, pt is scheduled for ECHO on 04/18/10 with [facility] cardiology Will you clear for surgery pending ECHO results? Surgery has not been scheduled yet.... Shary Decamp  March 30, 2010 10:28 AM   Follow-up for Phone Call        if ECHO stable-okay he will be clear from my standpoint. If needed we can ask cardiology to do the ECHO sooner [facility] E. Paz MD  March 31, 2010 9:48 AM  discussed with Carney Bern -- waiting on ECHO results Shary Decamp  March 31, 2010 3:58 PM ECHO mild LVH and  AoS Plan: clear  for surgery as long as anesthesia knows that he has a mild AoS, send ECHO to surgery  Jose E. Paz MD  April 04, 2010 11:33 AM   letter, echo faxed to [facility]  April 04, 2010 1:44 PM

## 2011-01-24 NOTE — Letter (Signed)
Summary: [facility]  [facility]   Imported By: Lanelle Bal 04/06/2010 09:18:05  _____________________________________________________________________  External Attachment:    Type:   Image     Comment:   External Document

## 2011-01-24 NOTE — Letter (Signed)
Summary: [facility]  [facility]   Imported By: Lanelle Bal 04/06/2010 09:19:44  _____________________________________________________________________  External Attachment:    Type:   Image     Comment:   External Document

## 2011-01-24 NOTE — Letter (Signed)
Summary: Pre Visit Letter Revised  [facility]  [address] [place], [place] [address]   Phone: [phone]  Fax: [phone]        10/10/2010 MRN: [id number] Dale Wong [address] [place], [place]  [address]             Procedure Date:  11/21/2010   Welcome to the Gastroenterology Division at [facility].    You are scheduled to see a nurse for your pre-procedure visit on 10/31/2010 at 1:30PM on the 3rd floor at [facility], [address]  We ask that you try to arrive at our office 15 minutes prior to your appointment time to allow for check-in.  Please take a minute to review the attached form.  If you answer "Yes" to one or more of the questions on the first page, we ask that you call the person listed at your earliest opportunity.  If you answer "No" to all of the questions, please complete the rest of the form and bring it to your appointment.    Your nurse visit will consist of discussing your medical and surgical history, your immediate family medical history, and your medications.   If you are unable to list all of your medications on the form, please bring the medication bottles to your appointment and we will list them.  We will need to be aware of both prescribed and over the counter drugs.  We will need to know exact dosage information as well.    Please be prepared to read and sign documents such as consent forms, a financial agreement, and acknowledgement forms.  If necessary, and with your consent, a friend or relative is welcome to sit-in on the nurse visit with you.  Please bring your insurance card so that we may make a copy of it.  If your insurance requires a referral to see a specialist, please bring your referral form from your primary care physician.  No co-pay is required for this nurse visit.     If you cannot keep your appointment, please call [phone] to cancel or reschedule prior to your appointment date.  This  allows [place] the opportunity to schedule an appointment for another patient in need of care.    Thank you for choosing [facility] for your medical needs.  We appreciate the opportunity to care for you.  Please visit [place] at our website  to learn more about our practice.  Sincerely, The Gastroenterology Division

## 2011-01-24 NOTE — Letter (Signed)
Summary: Primary Care Consult Scheduled Letter  [facility]  [address] [place], [place] [address]   Phone: [phone]  Fax: [phone]      03/25/2010 MRN: [id number]  [facility] [address] [place], [place]  [address]    Dear Dale Wong,    We have scheduled an appointment for you.  At the recommendation of Dr. Willow Ora, we have scheduled you an ECHOCARDIOGRAM with [name] on 04-18-2010 at 2:00pm.  Their address is [address] 3rd floor, [place] [address]. The office phone number is [phone].  If this appointment day and time is not convenient for you, please feel free to call the office of the doctor you are being referred to at the number listed above and reschedule the appointment.    It is important for you to keep your scheduled appointments. We are here to make sure you are given good patient care.   Thank you,    Renee, Patient Care Coordinator [place] at [facility]

## 2011-01-24 NOTE — Letter (Signed)
Summary: [facility]  [facility]   Imported By: Lanelle Bal 04/06/2010 09:18:48  _____________________________________________________________________  External Attachment:    Type:   Image     Comment:   External Document

## 2011-01-24 NOTE — Progress Notes (Signed)
Summary: Schedule OV to discuss Colonoscopy   Phone Note Outgoing Call Call back at [facility] Phone [phone]   Call placed by: Harlow Mares CMA Duncan Dull),  February 24, 2010 10:15 AM Call placed to: Patient Summary of Call: spoke to patients male friend and she and patient are aware that patient is a little over due for his colonoscopy he has recently fell and doing rehab for the fall. I explained with his hx of colon cancer it is most important that he has his repeat colonoscopy done on time and that as soon as he is done with his rehab for them to call back to schedule an office visit to discuss having his procedure done. She assured me that they would.  Initial call taken by: Harlow Mares CMA (AAMA),  February 24, 2010 10:17 AM

## 2011-01-24 NOTE — Progress Notes (Signed)
Summary: clear for surgery, letter faxed to [facility]  ATTN:Kathy [phone]  Patient Dale Wong DOB 11/18/1927 is clear for surgery.  I have attached a copy of his ECHO.  Please make sure anesthesia knows that patient has mild aortic stenosis.  Please contact our office with any questions or concerns. Shary Decamp, [place] [phone] ext [phone] fax [phone]

## 2011-01-24 NOTE — Miscellaneous (Signed)
Summary: [facility]  Clinical Lists Changes  Medications: Added new medication of MOVIPREP 100 GM  SOLR (PEG-KCL-NACL-NASULF-NA ASC-C) As per prep instructions. - Signed Rx of MOVIPREP 100 GM  SOLR (PEG-KCL-NACL-NASULF-NA ASC-C) As per prep instructions.;  #1 x 0;  Signed;  Entered by: Ezra Sites RN;  Authorized by: Louis Meckel MD;  Method used: [facility]  [address] [id number] [address], [place], [place]  [address], Ph: [phone] or [phone], Fax: [phone] Observations: Added new observation of NKA: T (10/31/2010 13:37)    Prescriptions: MOVIPREP 100 GM  SOLR (PEG-KCL-NACL-NASULF-NA ASC-C) As per prep instructions.  #1 x 0   Entered by:   Ezra Sites RN   Authorized by:   Louis Meckel MD   Signed by:   Ezra Sites RN on 10/31/2010   Method used:   [facility]        [facility]  [address] [id number]       [address]       [place], [place]  [address]       Ph: [phone] or [phone]       Fax: [phone]   RxID:   [id number]

## 2011-01-24 NOTE — Procedures (Signed)
Summary: Colonoscopy  Patient: Dale Wong Note: All result statuses are Final unless otherwise noted.  Tests: (1) Colonoscopy (COL)   COL Colonoscopy           DONE     [facility]     [address]     [place], [place]  [address]           COLONOSCOPY PROCEDURE REPORT           PATIENT:  Dale Wong, Dale Wong  MR#:  [id number]     BIRTHDATE:  [birth year], 83 yrs. old  GENDER:  male           ENDOSCOPIST:  Barbette Hair. Arlyce Dice, MD     Referred by:           PROCEDURE DATE:  11/21/2010     PROCEDURE:  Diagnostic Colonoscopy     ASA CLASS:  Class II     INDICATIONS:  1) screening  2) history of colon cancer Index tumor     resection 1998           MEDICATIONS:   Fentanyl 25 mcg IV, Versed 5 mg IV           DESCRIPTION OF PROCEDURE:   After the risks benefits and     alternatives of the procedure were thoroughly explained, informed     consent was obtained.  Digital rectal exam was performed and     revealed no abnormalities.   The LB 180AL [id number] endoscope was     introduced through the anus and advanced to the anastomosis,     without limitations.  The quality of the prep was good, using     MoviPrep.  The instrument was then slowly withdrawn as the colon     was fully examined.     <<PROCEDUREIMAGES>>           FINDINGS:  Scattered diverticula were found (see image8). sigmoid     to transverse colon  This was otherwise a normal examination of     the colon (see image2, image3, image5, image7, image9, image11,     image12, and image14).   Retroflexed views in the rectum revealed     no abnormalities.    The time to anastamosis =  3.0  minutes. The     scope was then withdrawn (time =  7.0  min) from the patient and     the procedure completed.           COMPLICATIONS:  None           ENDOSCOPIC IMPRESSION:     1) Diverticula, scattered     2) Otherwise normal examination     RECOMMENDATIONS:     1) Return to the care of your primary provider. GI follow up as     needed  (age, tumor-free x 13 years)           REPEAT EXAM:  No           ______________________________     Barbette Hair. Arlyce Dice, MD           CC: Willow Ora, MD           n.     Rosalie Doctor:   Barbette Hair. Kaplan at 11/21/2010 11:52 AM           Bonney Roussel, [id number]  Note: An exclamation mark (!) indicates a result that was not dispersed into the flowsheet. Document Creation Date: 11/21/2010 11:53  AM _______________________________________________________________________  (1) Order result status: Final Collection or observation date-time: 11/21/2010 11:45 Requested date-time:  Receipt date-time:  Reported date-time:  Referring Physician:   Ordering Physician: Melvia Heaps [id number]) Specimen Source:  Source: Launa Grill Order Number: [id number] Lab site:

## 2011-03-11 LAB — DIFFERENTIAL
Basophils Absolute: 0 10*3/uL (ref 0.0–0.1)
Basophils Relative: 0 % (ref 0–1)
Eosinophils Absolute: 0.1 10*3/uL (ref 0.0–0.7)
Eosinophils Relative: 1 % (ref 0–5)
Lymphocytes Relative: 17 % (ref 12–46)
Lymphs Abs: 1.1 10*3/uL (ref 0.7–4.0)
Monocytes Absolute: 0.6 10*3/uL (ref 0.1–1.0)
Monocytes Relative: 9 % (ref 3–12)
Neutro Abs: 4.5 10*3/uL (ref 1.7–7.7)
Neutrophils Relative %: 72 % (ref 43–77)

## 2011-03-11 LAB — CBC
HCT: 43.1 % (ref 39.0–52.0)
Hemoglobin: 14.6 g/dL (ref 13.0–17.0)
MCH: 30.4 pg (ref 26.0–34.0)
MCHC: 33.9 g/dL (ref 30.0–36.0)
MCV: 89.7 fL (ref 78.0–100.0)
Platelets: 138 10*3/uL — ABNORMAL LOW (ref 150–400)
RBC: 4.8 MIL/uL (ref 4.22–5.81)
RDW: 15.4 % (ref 11.5–15.5)
WBC: 6.3 10*3/uL (ref 4.0–10.5)

## 2011-03-11 LAB — URINALYSIS, ROUTINE W REFLEX MICROSCOPIC
Bilirubin Urine: NEGATIVE
Glucose, UA: NEGATIVE mg/dL
Hgb urine dipstick: NEGATIVE
Ketones, ur: NEGATIVE mg/dL
Nitrite: NEGATIVE
Protein, ur: NEGATIVE mg/dL
Specific Gravity, Urine: 1.025 (ref 1.005–1.030)
Urobilinogen, UA: 1 mg/dL (ref 0.0–1.0)
pH: 7.5 (ref 5.0–8.0)

## 2011-03-11 LAB — BASIC METABOLIC PANEL
BUN: 19 mg/dL (ref 6–23)
CO2: 24 mEq/L (ref 19–32)
Calcium: 9.3 mg/dL (ref 8.4–10.5)
Chloride: 104 mEq/L (ref 96–112)
Creatinine, Ser: 0.71 mg/dL (ref 0.4–1.5)
GFR calc Af Amer: >60 mL/min (ref >60)
GFR calc non Af Amer: >60 mL/min (ref >60)
Glucose, Bld: 101 mg/dL — ABNORMAL HIGH (ref 70–99)
Potassium: 3.9 mEq/L (ref 3.5–5.1)
Sodium: 137 mEq/L (ref 135–145)

## 2011-03-11 LAB — SAMPLE TO BLOOD BANK

## 2011-05-11 ENCOUNTER — Other Ambulatory Visit: Payer: Self-pay | Admitting: Internal Medicine

## 2011-05-12 ENCOUNTER — Encounter: Payer: Self-pay | Admitting: Internal Medicine

## 2011-05-12 ENCOUNTER — Ambulatory Visit (INDEPENDENT_AMBULATORY_CARE_PROVIDER_SITE_OTHER): Payer: Medicare Other | Admitting: Internal Medicine

## 2011-05-12 DIAGNOSIS — B351 Tinea unguium: Secondary | ICD-10-CM

## 2011-05-12 DIAGNOSIS — B353 Tinea pedis: Secondary | ICD-10-CM

## 2011-05-12 DIAGNOSIS — E785 Hyperlipidemia, unspecified: Secondary | ICD-10-CM

## 2011-05-12 DIAGNOSIS — L259 Unspecified contact dermatitis, unspecified cause: Secondary | ICD-10-CM

## 2011-05-12 DIAGNOSIS — I739 Peripheral vascular disease, unspecified: Secondary | ICD-10-CM

## 2011-05-12 DIAGNOSIS — L309 Dermatitis, unspecified: Secondary | ICD-10-CM

## 2011-05-12 DIAGNOSIS — I872 Venous insufficiency (chronic) (peripheral): Secondary | ICD-10-CM | POA: Insufficient documentation

## 2011-05-12 LAB — LIPID PANEL
Cholesterol: 126 mg/dL (ref 0–200)
LDL Cholesterol: 68 mg/dL (ref 0–99)
Triglycerides: 79 mg/dL (ref 0.0–149.0)
VLDL: 15.8 mg/dL (ref 0.0–40.0)

## 2011-05-12 MED ORDER — HYDROCORTISONE 2.5 % EX CREA: TOPICAL_CREAM | Freq: Two times a day (BID) | CUTANEOUS | Status: DC

## 2011-05-12 MED ORDER — KETOCONAZOLE 2 % EX CREA: TOPICAL_CREAM | Freq: Two times a day (BID) | CUTANEOUS | Status: DC

## 2011-05-12 NOTE — Assessment & Plan Note (Addendum)
advanced onychomycosis, I'm not convinced that  prescribed in 3 months of antifungals is in the best pt interest. Will refer to podiatry  for trimming only

## 2011-05-12 NOTE — Progress Notes (Signed)
  Subjective:  [place]  Patient ID: Dale Wong, male    DOB: [birth year], 77 y.o.   MRN: [id number]  HPI Here requesting blood work. Long history of onychomycoses, wonders what can be done about it. 3 weeks ago, he developed blisters at the pretibial area, his partner use a over-the-counter antiseptic liquid that seem to help. It looks better now. The rash has not itch or hurt.  Past Medical History  Diagnosis Date  . Hyperlipidemia   . Colon cancer     Last colonoscopy November 2011,  . Fatigue   . Parkinson disease     Sees neurology in [place]  . Depression   . Dementia     sees neurology in [place]   Past Surgical History  Procedure Date  . Spinal fusion     back surgery for spinal stenosis  . Shoulder arthroscopy 4/11    s/p  . Colectomy        Review of Systems H/o Depression, dementia, per partner, sx  improved  After was started on namenda a month ago Edema at baseline     Objective:   Physical Exam Alert, oriented, no apparent distress. Compared to a few months ago he seems much more interactive and alert Skin, both pretibial areas with chronic appearing changes: Skin is hyperpigmented, not warm, violaceous in color. Has some stuck yellow crust . Advanced onychomycoses throughout the toe was with some maceration between the toes. Pedal pulses decreased, capillary refill in the toes also slightly slow. Also noted maceration and redness in the groin.         Assessment & Plan:

## 2011-05-12 NOTE — Assessment & Plan Note (Signed)
Due for labs

## 2011-05-12 NOTE — Assessment & Plan Note (Signed)
Stasis  dermatitis, will treat with leg elevation, steroids. Also has fungal dermatitis between the toes and in the groins. Will treat with Nizoral Reassess in 3 weeks. If not better, refer to dermatology.

## 2011-05-12 NOTE — Assessment & Plan Note (Addendum)
Question of vascular insufficiency on exam, I was unable to get the patient on the table to get a better exam of his femoral pulses. Will check ABIs Addendum: [facility] for ABIs

## 2011-05-12 NOTE — Op Note (Signed)
Dale Wong, Dale Wong                 ACCOUNT NO.:  [id number]   MEDICAL RECORD NO.:  [id number]          PATIENT TYPE:  INP   LOCATION:  3006                         FACILITY:  [facility]   PHYSICIAN:  Donalee Citrin, M.D.        DATE OF BIRTH:  [birth year]   DATE OF PROCEDURE:  11/29/2005  DATE OF DISCHARGE:                                 OPERATIVE REPORT   PREOPERATIVE DIAGNOSIS:  Severe lumbar spinal stenosis from synovial cyst,  left side, L3-L4 with facet arthropathy and ligamentous hypertrophy as well  as spinal stenosis at L4-L5 due to predominantly facet arthropathy and  ligamentous hypertrophy.   POSTOPERATIVE DIAGNOSIS:  Severe lumbar spinal stenosis from synovial cyst,  left side, L3-L4 with facet arthropathy and ligamentous hypertrophy as well  as spinal stenosis at L4-L5 due to predominantly facet arthropathy and  ligamentous hypertrophy.   PROCEDURE:  Decompressive laminectomy L3-L4 and L4-L5 bilaterally.   SURGEON:  Donalee Citrin, M.D.   ASSISTANT:  Tia Alert, MD   ANESTHESIA:  General endotracheal anesthesia.   HISTORY OF PRESENT ILLNESS:  The patient is a very pleasant 77 year old  gentleman who has had long-standing back and bilateral left leg pain and  neurogenic claudication going on for 6-8 months where he claudicates for  about half a block.  Preop imaging showed severe spinal stenosis at 3-4 and  4-5 with the suggestion of a synovial cyst on the left side at 3-4 causing  severe spinal stenosis.  The patient was recommended decompressive  laminectomy due to the patient's inability to ambulate for longer than just  50 yards or so and progressive weakness of his legs.  I discussed the risks  and benefits of the surgery which he understood and agreed to proceed forth.   DESCRIPTION OF PROCEDURE:  The patient was brought to the operating room,  given general anesthesia, positioned prone on the Wilson frame.  The back  was prepped and draped in the usual sterile  fashion.  After a preop x-ray  localized the L2-L3 disc space and then a midline incision was made just  inferior to this.  Bovie electrocautery was used to dissect through the  subcutaneous tissue and subperiosteal dissection was carried out at the  lamina of L2, L3, L4, and L5 bilaterally.  Interoperative x-ray confirmed  localization of the probe of the disc space so decompressive laminectomies  were become at the two interspaces inferior to this.  The spinous process of  3 and 4 were removed in a piecemeal fashion with Leksell rongeur and then 3  mm Kerrison punch was used to initiate the central decompression.  The  ligament was noted to be markedly hypertrophied causing severe spinal  stenosis.  So, working above the ligament but underneath the lamina  bilaterally, the laminotomy was widened, both at 3-4 and 4-5.  Then, using a  #4 Penfield, the ligament was dissected free predominantly on the right side  initially to gain access to the dura.  The dura was then visualized and the  right sided gutter was cleaned out then the synovial  cyst was immediately  visualized at the left side at L3-L4.  Then, using a #4 Penfield, the plane  between the dura and the under surface of the medial aspect of the synovial  cyst was developed and then the synovial cyst was excised for working  cephalocaudally in a piecemeal fashion with a 3 mm Kerrison punch.  After  the excision of the generous synovial cyst, the thecal sac was noted to be  markedly decompressed.  The remainder of the lateral gutter along the left  side was under bitten as well as on the right side.  At the end of the  laminotomy, the 3, 4, and 5 roots were widely decompressed and the foramen  were patent and explored with a coronary dilator and hockey stick.  There  was noted to be a small pin hole split thickness tear on the dura in the  midline, this was felt to be due to a shard that was on the suction tip as  this happened when I  was performing the laminectomy well away from the site.  This was very slowly slightly leaking a small amount of spinal fluid, so  Tisseel was overlaid on top of this as well as a 1 by 1 cut in half Duragen  patch and Tisseel overlaid on top of the Duragen, then Gelfoam was overlaid  on top of this.  The muscle and fascia were reapproximated with 0  interrupted Vicryl after meticulous hemostasis had been maintained.  The  skin was reinforced with Dermabond.  Benzoin and Steri-Strips were applied.  The patient went to the recovery room in stable condition.  At the end of  the case, the needle, sponge, and instrument counts were correct.           ______________________________  Donalee Citrin, M.D.     GC/MEDQ  D:  11/29/2005  T:  11/29/2005  Job:  [id number]

## 2011-05-15 ENCOUNTER — Telehealth: Payer: Self-pay | Admitting: *Deleted

## 2011-05-15 DIAGNOSIS — I739 Peripheral vascular disease, unspecified: Secondary | ICD-10-CM

## 2011-05-15 NOTE — Progress Notes (Signed)
Addended by: Army Fossa on: 05/15/2011 03:59 PM   Modules accepted: Orders

## 2011-05-15 NOTE — Telephone Encounter (Signed)
Message copied by Army Fossa on Mon May 15, 2011  2:27 PM ------      Message from: Donzetta Kohut      Created: Mon May 15, 2011  2:04 PM       I do not have authorization to enter orders.  I will schedule, but they will require an order to be in the system.            ----- Message -----         From: Wanda Plump, MD         Sent: 05/13/2011  10:51 AM           To: Donzetta Kohut            Please schedule ABIs , I couldn't find how to do it       DX Peripheral vascular disease

## 2011-05-16 NOTE — Telephone Encounter (Signed)
Addended by: Army Fossa on: 05/16/2011 07:57 AM   Modules accepted: Orders

## 2011-05-16 NOTE — Telephone Encounter (Signed)
Removed aBI

## 2011-05-24 ENCOUNTER — Other Ambulatory Visit: Payer: Self-pay | Admitting: *Deleted

## 2011-05-24 ENCOUNTER — Encounter: Payer: Self-pay | Admitting: *Deleted

## 2011-05-24 DIAGNOSIS — I739 Peripheral vascular disease, unspecified: Secondary | ICD-10-CM

## 2011-06-08 ENCOUNTER — Ambulatory Visit: Payer: Self-pay | Admitting: Internal Medicine

## 2011-08-15 ENCOUNTER — Other Ambulatory Visit: Payer: Self-pay | Admitting: Internal Medicine

## 2011-08-15 NOTE — Telephone Encounter (Signed)
Rx Done . 

## 2011-11-28 ENCOUNTER — Other Ambulatory Visit: Payer: Self-pay | Admitting: Internal Medicine

## 2011-12-01 NOTE — Telephone Encounter (Signed)
Las OV 05/12/11

## 2011-12-21 NOTE — Telephone Encounter (Signed)
Dr.Paz this Rx is not on the patient's med list. Look like he is taking Cardbidopa-levodopa-entacapone 12.5-50-200. Please advise    Kp

## 2011-12-25 NOTE — Telephone Encounter (Signed)
Discussed with patient and he voiced understanding    KP 

## 2011-12-25 NOTE — Telephone Encounter (Signed)
I can.t  refill this prescription, advised patient to contact his neurologist

## 2012-04-29 ENCOUNTER — Telehealth: Payer: Self-pay | Admitting: *Deleted

## 2012-04-29 NOTE — Telephone Encounter (Signed)
Appt scheduled

## 2012-04-29 NOTE — Telephone Encounter (Signed)
Please schedule an appointment.

## 2012-04-29 NOTE — Telephone Encounter (Signed)
[facility] Triage Call Report Triage Record Num: [id number] Operator: Ether Griffins Patient Name: Dale Wong Call Date & Time: 04/26/2012 9:24:41PM Patient Phone: [phone] PCP: Patient Gender: Male PCP Fax : Patient DOB: 1927/07/26 Practice Name: [facility] Reason for Call: Caller: Fuller Mandril; PCP: Willow Ora; CB#: [phone]; Calling about possible carpet burn on hip--red area,not painful. Wife just noticed it 2 days ago when she bathed him. Fell 4 weeks ago. Put baby lotion and neosporin on it--no longer scaly but still red.Advised to follow up with MD on Monday am or call back if any worsening sxs. Protocol(s) Used: No Guideline - Advice Per Reference (Adult) Recommended Outcome per Protocol: See Provider within 72 Hours Reason for Outcome: SEE PROVIDER WITHIN 72 HOURS Care Advice: ~ 05/

## 2012-05-01 ENCOUNTER — Ambulatory Visit (INDEPENDENT_AMBULATORY_CARE_PROVIDER_SITE_OTHER): Payer: MEDICARE | Admitting: Internal Medicine

## 2012-05-01 VITALS — BP 154/86 | HR 73 | Temp 97.6°F | Wt 232.0 lb

## 2012-05-01 DIAGNOSIS — I8310 Varicose veins of unspecified lower extremity with inflammation: Secondary | ICD-10-CM

## 2012-05-01 DIAGNOSIS — I872 Venous insufficiency (chronic) (peripheral): Secondary | ICD-10-CM

## 2012-05-01 DIAGNOSIS — S7000XA Contusion of unspecified hip, initial encounter: Secondary | ICD-10-CM

## 2012-05-01 MED ORDER — DOXYCYCLINE HYCLATE 100 MG PO TABS: 100.0000 mg | ORAL_TABLET | Freq: Two times a day (BID) | ORAL | Status: AC

## 2012-05-01 MED ORDER — KETOCONAZOLE 2 % EX CREA: TOPICAL_CREAM | Freq: Every day | CUTANEOUS | Status: DC

## 2012-05-01 MED ORDER — FUROSEMIDE 20 MG PO TABS: 20.0000 mg | ORAL_TABLET | Freq: Every day | ORAL | Status: DC

## 2012-05-01 NOTE — Assessment & Plan Note (Addendum)
Patient was seen last year with similar issues but at this point he has worsening statsis dermatitis, worse fungal skin infection and onychomycosis aggravated by poor self-care. Plan: Ketoconazole Doxycycline to treat possible skin infection [facility], he may benefit from whirlpool treatments Leg elevation and Lasix to help with edema Reassess in one month Patient actually is taking care of by the [facility] system, I will focus on his dermatitis today. Reportedly they are thinking about a NH admission

## 2012-05-01 NOTE — Patient Instructions (Addendum)
We are referring you to the [facility] Doxycycline for 10 days Lasix every morning. Keep the legs elevated at least 60 minutes twice a day Apply ketoconazole cream between the toes twice a day You do need to see a podiatrist at your earliest convenience for nail trimming. Please come back in 3 weeks. ------ Get a hip XR at [facility], corner of [address] (10 minutes form here); they are open 24/7 [address]  [place], [place] [address] [phone]

## 2012-05-01 NOTE — Progress Notes (Signed)
  Subjective:    Patient ID: Dale Wong, male    DOB: August 22, 1927, 78 y.o.   MRN: [id number]  HPI Here with his significant other. The reason for the appointment is swelling in the legs, the swelling is gradually getting worse over the last year when I saw him last. He gets his regular care at the [facility] but they have not addressed his swelling. Last year he was recommended to see a podiatrist but that never happened. He also had a fall several days ago and landed on his left hip, complaining of some pain.  Past Medical History  Diagnosis Date  . Hyperlipidemia   . Colon cancer     Last colonoscopy November 2011,  . Fatigue   . Parkinson disease     Sees neurology in [place]  . Depression   . Dementia     sees neurology in [place]    Past Surgical History  Procedure Date  . Spinal fusion     back surgery for spinal stenosis  . Shoulder arthroscopy 4/11    s/p  . Colectomy      Review of Systems No fever or chills    Objective:   Physical Exam Alert, no apparent distress, elderly gentleman, looks chronically ill. Gait with difficulty, uses a walker. Hips: Good range of motion, seems symmetric. He has ecchymoses at the lateral left hip. Lower extremities: He has significant edema, skin is  violaceous , not really warm. has a superficial open sore (3x2 cm) at the right pretibial area with some oozing but no focal fluctuance or actual pus. He has a number of white-hard lesion (Stucco keratosis?) through the legs below the knee   Has maceration between the toes and the nails are extremely dystrophic and long.     Assessment & Plan:    Hip contusion, will get an x-ray.

## 2012-05-02 ENCOUNTER — Encounter: Payer: Self-pay | Admitting: Internal Medicine

## 2012-05-06 ENCOUNTER — Encounter: Payer: Self-pay | Admitting: Internal Medicine

## 2012-05-06 ENCOUNTER — Telehealth: Payer: Self-pay | Admitting: Internal Medicine

## 2012-05-06 NOTE — Telephone Encounter (Signed)
Pts caretaker called and is requesting the name and number of the podiatrist Dr. [name] wanted the pt to see last year.

## 2012-05-06 NOTE — Telephone Encounter (Signed)
1. No cream until they see the doctor at the [facility] 2. Doesn't need an appointment to see a podiatrist, I recommend [facility]

## 2012-05-06 NOTE — Telephone Encounter (Signed)
Pts caretaker also called stating that they are going to the wound care center this coming Friday. She would like to know if there is a cream that she should be applying to the pt's legs between now & Friday. Please advise.

## 2012-05-07 NOTE — Telephone Encounter (Signed)
Discussed with pt

## 2012-05-08 ENCOUNTER — Ambulatory Visit (INDEPENDENT_AMBULATORY_CARE_PROVIDER_SITE_OTHER)
Admission: RE | Admit: 2012-05-08 | Discharge: 2012-05-08 | Disposition: A | Discharge status: Home / Self Care | Payer: MEDICARE | Source: Ambulatory Visit | Attending: Internal Medicine | Admitting: Internal Medicine

## 2012-05-08 ENCOUNTER — Telehealth: Payer: Self-pay | Admitting: Internal Medicine

## 2012-05-08 DIAGNOSIS — S7000XA Contusion of unspecified hip, initial encounter: Secondary | ICD-10-CM

## 2012-05-08 NOTE — Telephone Encounter (Signed)
Noted  

## 2012-05-08 NOTE — Telephone Encounter (Signed)
patient never showed for Complete LT Hip X-ray, per call from [place] at the imaging center.

## 2012-05-08 NOTE — Telephone Encounter (Signed)
FYI

## 2012-05-10 ENCOUNTER — Encounter (HOSPITAL_BASED_OUTPATIENT_CLINIC_OR_DEPARTMENT_OTHER): Payer: MEDICARE

## 2012-05-14 ENCOUNTER — Telehealth: Payer: Self-pay | Admitting: Internal Medicine

## 2012-05-14 NOTE — Telephone Encounter (Signed)
Please advise 

## 2012-05-14 NOTE — Telephone Encounter (Signed)
Pts caregiver would like a call back from Dr. [name]. She states a family member is taking the pt to a nursing home in [place], [place] and they have had to cancel his appts at the wound care center. Carney Bern is upset about this and would like to speak with Dr. [name] regarding the pt's care.

## 2012-05-15 NOTE — Telephone Encounter (Signed)
Spoke w/Ms. Greeson. I advised her that Dr. [name] would not be able to speak with her regarding the pts care bc she is not the pts power of attorney or listed on his DPR. Pt is being sent DPR to sign and return, appointing Milana Huntsman. Ms. Joesphine Bare was upset and is worried about the pt's care, but voiced understanding.

## 2012-05-15 NOTE — Telephone Encounter (Signed)
Please find out if the caregiver ,Carney Bern, is the power of attorney. We'll need documentation before I discussed situation with her.

## 2012-05-15 NOTE — Telephone Encounter (Signed)
LMOVM

## 2012-05-16 NOTE — Telephone Encounter (Signed)
Patient's appointment has been rescheduled to Friday, 05/24/12.  I have left message for Erskine Squibb to return my call asap.

## 2012-05-16 NOTE — Telephone Encounter (Signed)
I was advised by my staff that there is a DPR in place. I spoke with Erskine Squibb, she  is concerned because she cancel the appointment with the wound care center last week thinking that the patient would be transferred to [facility] soon but that transfer to a nursing home is pending. Additionally, she reports that his legs look about the same, maybe less swelling, certainly no fever chills or discharge. I also told her that I do think a nursing home is appropriate for for Mr. Marengo  Plan: Luster Landsberg, please reschedule the appointment with the wound care center ASAP.

## 2012-05-16 NOTE — Telephone Encounter (Signed)
DPR has been returned via fax on 05/16/12 appointing Ms. Greeson.

## 2012-05-18 ENCOUNTER — Encounter: Payer: Self-pay | Admitting: Family Medicine

## 2012-05-18 ENCOUNTER — Ambulatory Visit (INDEPENDENT_AMBULATORY_CARE_PROVIDER_SITE_OTHER): Payer: MEDICARE | Admitting: Family Medicine

## 2012-05-18 VITALS — BP 130/80 | HR 68 | Temp 97.4°F | Wt 226.0 lb

## 2012-05-18 DIAGNOSIS — T148XXA Other injury of unspecified body region, initial encounter: Secondary | ICD-10-CM

## 2012-05-18 DIAGNOSIS — IMO0002 Reserved for concepts with insufficient information to code with codable children: Secondary | ICD-10-CM

## 2012-05-18 MED ORDER — DOXYCYCLINE HYCLATE 100 MG PO TABS: 100.0000 mg | ORAL_TABLET | Freq: Two times a day (BID) | ORAL | Status: AC

## 2012-05-18 NOTE — Assessment & Plan Note (Signed)
On L hip after a fall - with some scabbing and central area of erythema  (also has diffusely swollen legs with wounds) Some areas of wetness from skin to skin contact in groin- no pus or satellite lesions  Will start pt back empirically on doxycycline which he tolerates well  Disc cleaning all aff areas with antibacterial soap and water and drying well  Can use cornstarch in sweaty areas also  Will update if worse redness/fever or other symptoms and otherwise f/u with wound care center as planned

## 2012-05-18 NOTE — Patient Instructions (Signed)
Keep the wound area clean and dry - with antibacterial soap and water  Can use some corn starch in the skin folds that stay moist - after cleaning and drying them Start back on doxycycline - I sent to your pharmacy If worse redness/ drainage or any fever or new symptoms- please call  Follow up with the wound care center as scheduled

## 2012-05-18 NOTE — Progress Notes (Signed)
Subjective:    Patient ID: Dale Wong, male    DOB: 1927-09-15, 78 y.o.   MRN: [id number]  HPI Had a fall on his hip this month   Xray was fine   Had an abrasion on his hip - that has been weeping a lot  Is not hurting  Itches some  Looked like a "carpet burn" Not putting anything on it   Also has wounds/ rash on legs/ feet  Is seeing wound care clinic next week Just finished course of doxycycline for that   Patient Active Problem List  Diagnoses  . ADENOCARCINOMA, COLON  . HYPERLIPIDEMIA  . DEPRESSION  . PARKINSON'S DISEASE  . AORTIC VALVE DISORDERS  . FATIGUE, CHRONIC  . MEMORY LOSS  . GAIT DISTURBANCE  . EDEMA  . Dermatitis, stasis  . Peripheral vascular disease  . Onychomycosis  . Abrasion of skin   Past Medical History  Diagnosis Date  . Hyperlipidemia   . Colon cancer     Last colonoscopy November 2011,  . Fatigue   . Parkinson disease     Sees neurology in [place]  . Depression   . Dementia     sees neurology in [place]   Past Surgical History  Procedure Date  . Spinal fusion     back surgery for spinal stenosis  . Shoulder arthroscopy 4/11    s/p  . Colectomy    History  Substance Use Topics  . Smoking status: Never Smoker   . Smokeless tobacco: Not on file  . Alcohol Use: No   Family History  Problem Relation Age of Onset  . Liver cancer Brother   . Diabetes Neg Hx   . Coronary artery disease Neg Hx    No Known Allergies Current Outpatient Prescriptions on File Prior to Visit  Medication Sig Dispense Refill  . aspirin 81 MG tablet Take 81 mg by mouth daily.        . Calcium Carbonate-Vitamin D (CALTRATE 600+D) 600-400 MG-UNIT per chew tablet Chew 1 tablet by mouth daily.        . carbidopa-levodopa-entacapone (STALEVO) 12.5-50-200 MG per tablet Take 1 tablet by mouth 4 (four) times daily.        . citalopram (CELEXA) 20 MG tablet Take 10 mg by mouth daily.        . entacapone (COMTAN) 200 MG tablet Take 200 mg by mouth 4 (four) times daily.         . fish oil-omega-3 fatty acids 1000 MG capsule Take 2 g by mouth daily.        . furosemide (LASIX) 20 MG tablet Take 1 tablet (20 mg total) by mouth daily.  30 tablet  0  . ketoconazole (NIZORAL) 2 % cream Apply topically daily.  60 g  0  . Memantine HCl (NAMENDA PO) Take 2 tablets by mouth daily.        . Multiple Vitamins-Minerals (CENTRUM SILVER PO) Take by mouth.        . rivastigmine (EXELON) 4.6 mg/24hr Place 1 patch onto the skin daily.        . vitamin C (ASCORBIC ACID) 500 MG tablet Take 500 mg by mouth daily.              Review of Systems Review of Systems  Constitutional: Negative for fever, appetite change, fatigue and unexpected weight change.  Eyes: Negative for pain and visual disturbance.  Respiratory: Negative for cough and shortness of breath.   Cardiovascular: Negative for  cp or palpitations    Gastrointestinal: Negative for nausea, diarrhea and constipation.  Genitourinary: Negative for urgency and frequency.  Skin: Negative for pallor and pos for rash/ wound and thickened toenails  Neurological: Negative for focal numbness, headache/ pos for stiffness and gait disorder from parkinsons Hematological: Negative for adenopathy. Does not bruise/bleed easily.  Psychiatric/Behavioral: Negative for dysphoric mood. The patient is not nervous/anxious.         Objective:   Physical Exam  Constitutional: He appears well-developed and well-nourished. No distress.  HENT:  Head: Normocephalic and atraumatic.  Mouth/Throat: Oropharynx is clear and moist.  Eyes: Conjunctivae and EOM are normal. Pupils are equal, round, and reactive to light. Right eye exhibits no discharge. Left eye exhibits no discharge.  Neck: Neck supple. No thyromegaly present.  Cardiovascular: Normal rate.   Murmur heard. Pulmonary/Chest: Effort normal and breath sounds normal.  Abdominal: He exhibits no distension. There is no tenderness.  Musculoskeletal: He exhibits edema and tenderness.        Very stiff from parkinsons/ also unsteady  Loss of urine continence while in the room  Pt was cleaned and lifted onto the table   rom hip difficult to assess in light of cogwheel rigidity- but no bony tenderness  Lymphadenopathy:    He has no cervical adenopathy.  Neurological: He is alert. He exhibits abnormal muscle tone. Coordination abnormal.  Skin: Skin is warm and dry. Rash noted. There is erythema.       L hip and lateral thigh- abrasion present with diffuse erythema  Also some scale and skin thickening that resembles process on legs and feet  Moderate pedal edema   Thickened toenails   Areas in groin with skin to skin contact are wet with sweat and urine - no purulent discharge  They were cleaned and dried   Psychiatric: He has a normal mood and affect.          Assessment & Plan:

## 2012-05-22 ENCOUNTER — Telehealth: Payer: Self-pay | Admitting: *Deleted

## 2012-05-22 NOTE — Telephone Encounter (Signed)
[facility] Triage Call Report Triage Record Num: [id number] Operator: Thayer Headings Patient Name: Dale Wong Call Date & Time: 05/18/2012 11:40:29AM Patient Phone: [phone] PCP: Nolon Rod. Paz Patient Gender: Male PCP Fax : Patient DOB: Nov 01, 1927 Practice Name: [facility] Reason for Call: Caller: [name]; PCP: Willow Ora; CB#: [phone]; Calling today 05/18/12 regarding has abrasion on left hip. Goes down on to left leg. Is draining. Was seen in office on 04/30/12 related to fall. X-ray was done to left hip and no broken bones. Afebrile. Says area just looks raw. Emergent symptoms r/o by Abrasions, Lacerations, Puncture Wounds guidelines with exception of any signs and symptoms of worsening infection. Called [place] office to make sure ok to schedule appt out of order b/c due to travel time they can't make the earlier slots. Spoke with Dr. Milinda Antis and she advised ok to book in the 12:30 PM slot. Appt scheduled for 12:30 with Dr. Milinda Antis at [place] office. Protocol(s) Used: Abrasions, Lacerations, Puncture Wounds Recommended Outcome per Protocol: See Provider within 4 hours Reason for Outcome: Any signs and symptoms of worsening infection Care Advice: ~ SYMPTOM / CONDITION MANAGEMENT 05/18/2012 11:56:16AM Page 1 of 1 [email or web address]

## 2012-05-22 NOTE — Telephone Encounter (Signed)
Pt seen at [facility]. 

## 2012-05-23 NOTE — Telephone Encounter (Signed)
Call from few days ago, already seen at [facility]

## 2012-05-23 NOTE — Telephone Encounter (Signed)
Erskine Squibb was made aware of new appointment.

## 2012-05-24 ENCOUNTER — Encounter (HOSPITAL_BASED_OUTPATIENT_CLINIC_OR_DEPARTMENT_OTHER): Payer: MEDICARE | Attending: General Surgery

## 2012-05-24 ENCOUNTER — Ambulatory Visit (INDEPENDENT_AMBULATORY_CARE_PROVIDER_SITE_OTHER): Payer: MEDICARE | Admitting: Internal Medicine

## 2012-05-24 ENCOUNTER — Encounter: Payer: Self-pay | Admitting: Internal Medicine

## 2012-05-24 VITALS — BP 130/80 | HR 82 | Temp 98.4°F | Wt 227.6 lb

## 2012-05-24 DIAGNOSIS — G20 Parkinson's disease: Secondary | ICD-10-CM | POA: Insufficient documentation

## 2012-05-24 DIAGNOSIS — Z8673 Personal history of transient ischemic attack (TIA), and cerebral infarction without residual deficits: Secondary | ICD-10-CM | POA: Insufficient documentation

## 2012-05-24 DIAGNOSIS — G20A1 Parkinson's disease without dyskinesia, without mention of fluctuations: Secondary | ICD-10-CM | POA: Insufficient documentation

## 2012-05-24 DIAGNOSIS — I872 Venous insufficiency (chronic) (peripheral): Secondary | ICD-10-CM

## 2012-05-24 DIAGNOSIS — R609 Edema, unspecified: Secondary | ICD-10-CM | POA: Insufficient documentation

## 2012-05-24 DIAGNOSIS — F329 Major depressive disorder, single episode, unspecified: Secondary | ICD-10-CM | POA: Insufficient documentation

## 2012-05-24 DIAGNOSIS — F0390 Unspecified dementia without behavioral disturbance: Secondary | ICD-10-CM | POA: Insufficient documentation

## 2012-05-24 DIAGNOSIS — L97809 Non-pressure chronic ulcer of other part of unspecified lower leg with unspecified severity: Secondary | ICD-10-CM | POA: Insufficient documentation

## 2012-05-24 DIAGNOSIS — Z981 Arthrodesis status: Secondary | ICD-10-CM | POA: Insufficient documentation

## 2012-05-24 DIAGNOSIS — I739 Peripheral vascular disease, unspecified: Secondary | ICD-10-CM

## 2012-05-24 DIAGNOSIS — F3289 Other specified depressive episodes: Secondary | ICD-10-CM | POA: Insufficient documentation

## 2012-05-24 DIAGNOSIS — I8310 Varicose veins of unspecified lower extremity with inflammation: Secondary | ICD-10-CM

## 2012-05-24 DIAGNOSIS — R21 Rash and other nonspecific skin eruption: Secondary | ICD-10-CM | POA: Insufficient documentation

## 2012-05-24 DIAGNOSIS — Z85038 Personal history of other malignant neoplasm of large intestine: Secondary | ICD-10-CM | POA: Insufficient documentation

## 2012-05-24 MED ORDER — FUROSEMIDE 20 MG PO TABS: ORAL_TABLET | ORAL | Status: DC

## 2012-05-24 NOTE — Assessment & Plan Note (Addendum)
Since the last time he was here, he is doing better, he actually got more antibiotics a few days ago from the saturday clinic. Seen at the  [facility] today, legs were wrapped, he was advised to keep the wrap on for one week. They will see him in one week. He was also saw a podiatrist and his nails were trimmed "they look great"

## 2012-05-24 NOTE — Progress Notes (Signed)
[facility]  NAME:  Dale Wong, Dale Wong NO.:  [id number]  MEDICAL RECORD NO.:  [id number]      DATE OF BIRTH:  [birth year]  PHYSICIAN:  Ardath Sax, M.D.           VISIT DATE:                                  OFFICE VISIT   This is an 78 year old man who has had many health issues including TIAs, Parkinson disease, depression, and dementia.  He also has a history of colon cancer in the past, apparently no recurrence.  He has had a spinal fusion for spinal stenosis.  He enters the [facility]ound Clinic because he has a history of a fall and since that time has a diffuse rash on the lateral left thigh, which looks like a fungal dermatitis. He also has bilateral leg edema, and what looks like a typical venous stasis ulcer on the anterior aspect of his left leg.  Today, we were treating the thigh with antifungal cream and we wrote him a prescription, and we were treating his legs with Bag Balm and Unna boots and will put a small piece of alginate to the small ulcer in the anterior aspect of his left leg.  His vital signs were normal, and we will see him back here in a week.     Ardath Sax, M.D.     PP/MEDQ  D:  05/24/2012  T:  05/24/2012  Job:  [id number]

## 2012-05-24 NOTE — Assessment & Plan Note (Signed)
At some point will need to get ABIs.

## 2012-05-24 NOTE — Patient Instructions (Signed)
Keep  legs elevated Lasix 20 mg one tablet daily, on Mondays Wednesday and Fridays take 2 tablets daily. Keep the followup with the [facility] Center Please come back in 4-5 weeks for a check up

## 2012-05-24 NOTE — Assessment & Plan Note (Addendum)
We started Lasix  earlier this month, check a BMP. Increase Lasix to 2 tablets daily on Mondays Wednesday and Friday Keep legs elevated.

## 2012-05-24 NOTE — Progress Notes (Signed)
  Subjective:    Patient ID: Dale Wong, male    DOB: 08/01/1927, 78 y.o.   MRN: [id number]  HPI Followup from previous visit. As far as the stasis dermatitis, they report that the antibiotic helped, he was prescribed additional antibiotics at the Saturday  clinic after he saw me. Good compliance with Lasix, edema is about the same. Nails were trimmed and they look much better. They saw the [facility] today, the legs were wrapped  Past Medical History  Diagnosis Date  . Hyperlipidemia   . Colon cancer     Last colonoscopy November 2011,  . Fatigue   . Parkinson disease     Sees neurology in [place]  . Depression   . Dementia     sees neurology in [place]     Review of Systems Not fatigue, dizziness or orthostatic weakness    Objective:   Physical Exam Alert, no apparent distress, looks chronically ill. Lungs clear to auscultation bilaterally Cardiovascular regular rate and rhythm, he has a systolic murmur. Lower extremities are wrapped from the knee down, unable to examine    Assessment & Plan:   Apparently the patient will be admitted to a nursing home locally, they will send paperwork whenever needed.

## 2012-05-25 LAB — BASIC METABOLIC PANEL
CO2: 30 mEq/L (ref 19–32)
Calcium: 9.4 mg/dL (ref 8.4–10.5)
Chloride: 105 mEq/L (ref 96–112)
Creat: 1.01 mg/dL (ref 0.50–1.35)
Glucose, Bld: 92 mg/dL (ref 70–99)

## 2012-05-26 ENCOUNTER — Encounter: Payer: Self-pay | Admitting: Internal Medicine

## 2012-05-31 ENCOUNTER — Encounter (HOSPITAL_BASED_OUTPATIENT_CLINIC_OR_DEPARTMENT_OTHER): Payer: MEDICARE | Attending: General Surgery

## 2012-05-31 DIAGNOSIS — L97809 Non-pressure chronic ulcer of other part of unspecified lower leg with unspecified severity: Secondary | ICD-10-CM | POA: Insufficient documentation

## 2012-05-31 DIAGNOSIS — G20A1 Parkinson's disease without dyskinesia, without mention of fluctuations: Secondary | ICD-10-CM | POA: Insufficient documentation

## 2012-05-31 DIAGNOSIS — I872 Venous insufficiency (chronic) (peripheral): Secondary | ICD-10-CM | POA: Insufficient documentation

## 2012-05-31 DIAGNOSIS — G20 Parkinson's disease: Secondary | ICD-10-CM | POA: Insufficient documentation

## 2012-05-31 DIAGNOSIS — Z8673 Personal history of transient ischemic attack (TIA), and cerebral infarction without residual deficits: Secondary | ICD-10-CM | POA: Insufficient documentation

## 2012-05-31 DIAGNOSIS — Z981 Arthrodesis status: Secondary | ICD-10-CM | POA: Insufficient documentation

## 2012-05-31 DIAGNOSIS — Z79899 Other long term (current) drug therapy: Secondary | ICD-10-CM | POA: Insufficient documentation

## 2012-05-31 DIAGNOSIS — L98499 Non-pressure chronic ulcer of skin of other sites with unspecified severity: Secondary | ICD-10-CM | POA: Insufficient documentation

## 2012-06-14 ENCOUNTER — Encounter (HOSPITAL_BASED_OUTPATIENT_CLINIC_OR_DEPARTMENT_OTHER): Payer: MEDICARE

## 2012-06-21 ENCOUNTER — Encounter (HOSPITAL_BASED_OUTPATIENT_CLINIC_OR_DEPARTMENT_OTHER): Payer: MEDICARE

## 2012-06-28 ENCOUNTER — Ambulatory Visit (INDEPENDENT_AMBULATORY_CARE_PROVIDER_SITE_OTHER): Payer: MEDICARE | Admitting: Internal Medicine

## 2012-06-28 ENCOUNTER — Encounter: Payer: Self-pay | Admitting: Internal Medicine

## 2012-06-28 ENCOUNTER — Telehealth: Payer: Self-pay | Admitting: Internal Medicine

## 2012-06-28 VITALS — BP 136/74 | HR 72 | Temp 98.0°F | Wt 219.0 lb

## 2012-06-28 DIAGNOSIS — IMO0002 Reserved for concepts with insufficient information to code with codable children: Secondary | ICD-10-CM

## 2012-06-28 DIAGNOSIS — R609 Edema, unspecified: Secondary | ICD-10-CM

## 2012-06-28 DIAGNOSIS — I8310 Varicose veins of unspecified lower extremity with inflammation: Secondary | ICD-10-CM

## 2012-06-28 DIAGNOSIS — R413 Other amnesia: Secondary | ICD-10-CM

## 2012-06-28 DIAGNOSIS — F3289 Other specified depressive episodes: Secondary | ICD-10-CM

## 2012-06-28 DIAGNOSIS — I872 Venous insufficiency (chronic) (peripheral): Secondary | ICD-10-CM

## 2012-06-28 DIAGNOSIS — F329 Major depressive disorder, single episode, unspecified: Secondary | ICD-10-CM

## 2012-06-28 DIAGNOSIS — T148XXA Other injury of unspecified body region, initial encounter: Secondary | ICD-10-CM

## 2012-06-28 LAB — BASIC METABOLIC PANEL
BUN: 29 mg/dL — ABNORMAL HIGH (ref 6–23)
CO2: 28 mEq/L (ref 19–32)
Calcium: 9.2 mg/dL (ref 8.4–10.5)
Creat: 0.96 mg/dL (ref 0.50–1.35)

## 2012-06-28 NOTE — Telephone Encounter (Signed)
Patients spouse says they did not get a call from the phone tree and this is the 2nd time this has happened. It is important for them to get reminders and she would like this reviewed Thanks

## 2012-06-28 NOTE — Assessment & Plan Note (Addendum)
Sees neurology Dr Brynda Rim in [facility]

## 2012-06-28 NOTE — Assessment & Plan Note (Signed)
Skin @ left hip is abnormal, initially thought to be an abrasion, appearance today is not consistent with an abrasion rather he seems to have a large patch of exzema.  To be seen by dermatology next week, I am somehow hesitant to prescribe steroids as it will change the appearance of the lesion difficulting the job of the dermatologist

## 2012-06-28 NOTE — Assessment & Plan Note (Signed)
Report symptoms are not as well-controlled as he would like, recommend to discuss with neurology since they are Rx all his psych meds

## 2012-06-28 NOTE — Progress Notes (Signed)
  Subjective:    Patient ID: Dale Wong, male    DOB: 1927/01/16, 78 y.o.   MRN: [id number]  HPI Here for followup Edema has decrease, good compliance with Lasix and leg elevation stasis dermatitis, status post evaluation at the wound care center. Doing better  Past Medical History  Diagnosis Date  . Hyperlipidemia   . Colon cancer     Last colonoscopy November 2011,  . Fatigue   . Parkinson disease     Sees neurology in [place]  . Depression   . Dementia     sees neurology in [place]   History   Social History  . Marital Status: Married    Spouse Name: N/A    Number of Children: N/A  . Years of Education: N/A   Occupational History  . Not on file.   Social History Main Topics  . Smoking status: Never Smoker   . Smokeless tobacco: Not on file  . Alcohol Use: No  . Drug Use: Yes  . Sexually Active: Not on file   Other Topics Concern  . Not on file   Social History Narrative   Lives w/ girlfriend-partnerADL independent so farStill drives     Review of Systems No chest pain or shortness or breath No nausea, vomiting, diarrhea He admits to some depression, no suicidal ideas.    Objective:   Physical Exam  Skin:       General -- alert, well-developed Lungs -- normal respiratory effort, no intercostal retractions, no accessory muscle use, and normal breath sounds.   Heart-- normal rate, regular rhythm, no murmur, and no gallop.   Extremities--  Chronic appearing skin changes at the pretibial area bilaterally, violaceous in color, no warm, nontender, few open sores with minimum oozing of clear fluid. He has +/+++ edema. Overall better compared to a few weeks ago. Still has some maceration between the toes. Nails are training. Also at the left hip he has a large patch of papular, slightly scaly, red but not warm skin. Psych--   not anxious appearing and not depressed appearing.       Assessment & Plan:

## 2012-06-28 NOTE — Assessment & Plan Note (Addendum)
Since the last office visit he was seen at the wound care center, leg wraps were removed. They signed off. Overall better. Recommend ongoing nail care with a podiatrist Recommend to continue with nizoral between the toes

## 2012-06-28 NOTE — Patient Instructions (Signed)
See the dermatologist next week Keep legs elevated at least one hour twice a day Continue the same medicines Keep using Nizoral cream between the toes every day See podiatrist from time to time to get your nails trimmed

## 2012-06-28 NOTE — Assessment & Plan Note (Signed)
Tolerating Lasix well, improving, see instructions BMP

## 2012-07-02 ENCOUNTER — Telehealth: Payer: Self-pay | Admitting: *Deleted

## 2012-07-02 ENCOUNTER — Encounter: Payer: Self-pay | Admitting: *Deleted

## 2012-07-02 NOTE — Telephone Encounter (Signed)
I don't see any contraindication

## 2012-07-02 NOTE — Telephone Encounter (Signed)
Tried calling pt, did not get an answer.

## 2012-07-02 NOTE — Telephone Encounter (Signed)
Pt came into the office today stating that the [place] gave him ammonium lactate lotion 12% BID to apply to effected area. Pt wants to make sure it is ok to use this lotion. Please advise.

## 2012-07-03 ENCOUNTER — Other Ambulatory Visit: Payer: Self-pay | Admitting: Dermatology

## 2012-07-03 NOTE — Telephone Encounter (Signed)
Discussed with pt

## 2012-07-05 NOTE — Telephone Encounter (Signed)
Review the phone tree report, and discuss with me.  Thanks, Harriett Sine

## 2012-08-23 ENCOUNTER — Ambulatory Visit (INDEPENDENT_AMBULATORY_CARE_PROVIDER_SITE_OTHER): Payer: MEDICARE | Admitting: Internal Medicine

## 2012-08-23 VITALS — BP 142/84 | HR 71 | Temp 97.6°F | Wt 222.0 lb

## 2012-08-23 DIAGNOSIS — I872 Venous insufficiency (chronic) (peripheral): Secondary | ICD-10-CM

## 2012-08-23 DIAGNOSIS — R609 Edema, unspecified: Secondary | ICD-10-CM

## 2012-08-23 DIAGNOSIS — I8310 Varicose veins of unspecified lower extremity with inflammation: Secondary | ICD-10-CM

## 2012-08-23 MED ORDER — FUROSEMIDE 20 MG PO TABS: ORAL_TABLET | ORAL | Status: DC

## 2012-08-23 MED ORDER — DOXYCYCLINE HYCLATE 100 MG PO TABS: 100.0000 mg | ORAL_TABLET | Freq: Two times a day (BID) | ORAL | Status: AC

## 2012-08-23 NOTE — Assessment & Plan Note (Signed)
Ran out of Lasix to 2 weeks ago, edema is worse but symmetric. Plan: Refill Lasix

## 2012-08-23 NOTE — Progress Notes (Signed)
  Subjective:    Patient ID: Dale Wong, male    DOB: 22-Nov-1927, 78 y.o.   MRN: [id number]  HPI Acute visit, skin around the ankles worse in the last couple of weeks Saw dermatology recently, was prescribed a stronger steroid for the hip rash which is better.  Past Medical History  Diagnosis Date  . Hyperlipidemia   . Colon cancer     Last colonoscopy November 2011,  . Fatigue   . Parkinson disease     Sees neurology in [place]  . Depression   . Dementia     sees neurology in [place]    Review of Systems No fever or chills Off Lasix for 3-4 weeks, he simply ran out and decided not to go back.    Objective:   Physical Exam  General -- alert, well-developed  Extremities--  Chronic appearing skin changes at the pretibial area bilaterally, violaceous in color, no warm, nontenderHe has ++/+++ edema.  Thick crust around the ankles, in some areas is easy to peel it off, underneath he has erythematous skin. Some maceration at the frontal left ankle. Dorsum of the foot slightly red Psych-- not anxious appearing and not depressed appearing.      Assessment & Plan:

## 2012-08-23 NOTE — Patient Instructions (Addendum)
Stop ammonium lactate as it is not helping Go back to Lasix as usual Doxycycline for 10 days Keep legs elevated for as long as you can See Dr. Terri Piedra September 9 at 9 am Call anytime if you have fever, get worse, you see discharge Come back in one month

## 2012-08-23 NOTE — Assessment & Plan Note (Addendum)
Skin around the ankles with  acute changes per pt  Pretibial area about the same Some redness  at the dorsum of the feet, early cellulitis? . Plan: Doxycycline Treat edema I asked dermatology to see ASAP.

## 2012-09-05 ENCOUNTER — Telehealth: Payer: Self-pay | Admitting: *Deleted

## 2012-09-05 NOTE — Telephone Encounter (Signed)
Spoke with [name], she is requesting paperwork be completed for pt to be admitted to [facility].  Pt is awaiting paperwork completion to begin admission process.

## 2012-09-06 NOTE — Telephone Encounter (Signed)
Spoke with ms. greeson & explained that the pt would need to come in for an OV to have paperwork filled out. Appt scheduled.

## 2012-09-20 ENCOUNTER — Ambulatory Visit: Payer: MEDICARE | Admitting: Internal Medicine

## 2012-09-23 ENCOUNTER — Ambulatory Visit (INDEPENDENT_AMBULATORY_CARE_PROVIDER_SITE_OTHER): Payer: MEDICARE | Admitting: Internal Medicine

## 2012-09-23 VITALS — BP 118/68 | HR 70 | Wt 228.0 lb

## 2012-09-23 DIAGNOSIS — I739 Peripheral vascular disease, unspecified: Secondary | ICD-10-CM

## 2012-09-23 DIAGNOSIS — I872 Venous insufficiency (chronic) (peripheral): Secondary | ICD-10-CM

## 2012-09-23 DIAGNOSIS — Z23 Encounter for immunization: Secondary | ICD-10-CM

## 2012-09-23 DIAGNOSIS — R627 Adult failure to thrive: Secondary | ICD-10-CM | POA: Insufficient documentation

## 2012-09-23 DIAGNOSIS — I8310 Varicose veins of unspecified lower extremity with inflammation: Secondary | ICD-10-CM

## 2012-09-23 DIAGNOSIS — E785 Hyperlipidemia, unspecified: Secondary | ICD-10-CM

## 2012-09-23 DIAGNOSIS — R609 Edema, unspecified: Secondary | ICD-10-CM

## 2012-09-23 MED ORDER — ZOSTER VACCINE LIVE 19400 UNT/0.65ML ~~LOC~~ SOLR: 0.6500 mL | Freq: Once | SUBCUTANEOUS | Status: DC

## 2012-09-23 NOTE — Assessment & Plan Note (Signed)
Improving, dermatology recommended to wrap his lower extremities, apply Epsom salts and a cream. Also, apparently ABIs were prescribed.

## 2012-09-23 NOTE — Assessment & Plan Note (Signed)
Patient with multiple medical problems, not being able to do most of his activities of daily living, I agree that he is a good candidate for a nursing home

## 2012-09-23 NOTE — Assessment & Plan Note (Signed)
On Pravachol, I don't think I will have a chance to check his cholesterol anytime soon, will check FLP

## 2012-09-23 NOTE — Assessment & Plan Note (Signed)
Improving, continue with Lasix and wrapping his lower extremities

## 2012-09-23 NOTE — Assessment & Plan Note (Signed)
Now that the edema is better, I been able to feel his pedal pulses. ABIs were ordered by dermatology according to the patient.

## 2012-09-23 NOTE — Progress Notes (Signed)
  Subjective:    Patient ID: Dale Wong, male    DOB: 1927-06-05, 78 y.o.   MRN: [id number]  HPI Followup Stasis  dermatitis, saw dermatology,doing better, see assessment and plan. High cholesterol, good medication compliance, due for labs Nursing home placement, patient and his partner feel that is the right next step to take. I agree. See social history    Past Medical History  Diagnosis Date  . Hyperlipidemia   . Colon cancer     Last colonoscopy November 2011,  . Fatigue   . Parkinson disease     Sees neurology in [place]  . Depression   . Dementia     sees neurology in [place]   Past Surgical History  Procedure Date  . Spinal fusion     back surgery for spinal stenosis  . Shoulder arthroscopy 4/11    s/p  . Colectomy     Social History: Divorced, lives at home lives w/ girlfriend-partner Dale Wong) No children Never Smoked Alcohol use-- no Drug use-yes Stopped driving ~ 2956   Review of Systems Denies chest pain or shortness of breath No nausea, vomiting, diarrhea As far as his activities of daily living, he is able to feed himself but Dale Wong does all other home chores. Unable to showering by himself, control of his bladder is poor, has not started on diapers so far. No problems with bowel incontinence.    Objective:   Physical Exam  General -- alert, well-developed Lungs -- normal respiratory effort, no intercostal retractions, no accessory muscle use, and normal breath sounds.   Heart-- normal rate, regular rhythm, soft syst  Murmur, not a new finding.    Extremities-- compared to last visit, the LE edema has improved substantially, the skin continue to be violaceous in color, nontender; his extremities are warm, he has good peda Neurologic-- alert & oriented X3 and strength normal in all extremities.l pulses. Psych--   not anxious appearing and not depressed appearing.         Assessment & Plan:  Probably going to a nursing home Flu shot and pneumonia shot  provided Prescription for Zostavax provided as well Extensive paper work completed Today , I spent more than 45  min with the patient, >50% of the time counseling, and coordinating his care

## 2012-09-24 LAB — CBC WITH DIFFERENTIAL/PLATELET
Basophils Absolute: 0 10*3/uL (ref 0.0–0.1)
Basophils Relative: 0.2 % (ref 0.0–3.0)
Eosinophils Absolute: 0.1 10*3/uL (ref 0.0–0.7)
Eosinophils Relative: 2.9 % (ref 0.0–5.0)
HCT: 35.9 % — ABNORMAL LOW (ref 39.0–52.0)
Hemoglobin: 11.8 g/dL — ABNORMAL LOW (ref 13.0–17.0)
Lymphocytes Relative: 23.4 % (ref 12.0–46.0)
Lymphs Abs: 1 10*3/uL (ref 0.7–4.0)
MCHC: 32.9 g/dL (ref 30.0–36.0)
MCV: 88.5 fl (ref 78.0–100.0)
Monocytes Absolute: 0.6 10*3/uL (ref 0.1–1.0)
Monocytes Relative: 13.5 % — ABNORMAL HIGH (ref 3.0–12.0)
Neutro Abs: 2.6 10*3/uL (ref 1.4–7.7)
Neutrophils Relative %: 60 % (ref 43.0–77.0)
Platelets: 117 10*3/uL — ABNORMAL LOW (ref 150.0–400.0)
RBC: 4.06 Mil/uL — ABNORMAL LOW (ref 4.22–5.81)
RDW: 15.9 % — ABNORMAL HIGH (ref 11.5–14.6)
WBC: 4.4 10*3/uL — ABNORMAL LOW (ref 4.5–10.5)

## 2012-09-24 LAB — LIPID PANEL
Cholesterol: 164 mg/dL (ref 0–200)
VLDL: 17.4 mg/dL (ref 0.0–40.0)

## 2012-09-24 LAB — AST: AST: 27 U/L (ref 0–37)

## 2012-09-24 LAB — ALT: ALT: 12 U/L (ref 0–53)

## 2012-09-24 LAB — TSH: TSH: 1.49 u[IU]/mL (ref 0.35–5.50)

## 2012-10-01 ENCOUNTER — Telehealth: Payer: Self-pay | Admitting: Internal Medicine

## 2012-10-01 DIAGNOSIS — D649 Anemia, unspecified: Secondary | ICD-10-CM

## 2012-10-01 NOTE — Telephone Encounter (Signed)
Labs reviewed: Cholesterol is satisfactory Has mild anemia , slt low iron and thrombocytopenia. History of colon cancer, normal colonoscopy 10/2010. --- I discussed the findings with the patient and Dale Wong, specifically we talk about  anemia, they do like to proceed with further eval, he probably needs the EGD. I asked him to come back in 6 months, I like to recheck a CBC and see how he is doing. --- Lillia Abed, Please arrange a GI office visit to see Dr. Arlyce Dice, EGD?

## 2012-10-02 ENCOUNTER — Telehealth: Payer: Self-pay | Admitting: Internal Medicine

## 2012-10-02 ENCOUNTER — Encounter: Payer: Self-pay | Admitting: Gastroenterology

## 2012-10-02 NOTE — Telephone Encounter (Signed)
Referral entered  

## 2012-10-02 NOTE — Telephone Encounter (Signed)
[name] is calling about when the appt for the referral will be made? Sorry wasn't sure what to tell her. amy

## 2012-10-03 ENCOUNTER — Ambulatory Visit: Payer: MEDICARE | Admitting: Internal Medicine

## 2012-10-03 NOTE — Telephone Encounter (Signed)
Patient is established with Dr. Arlyce Dice of [facility], and his first available appointment was 11-04-12, and patient's caregiver, Carney Bern, has been made aware.

## 2012-10-04 ENCOUNTER — Encounter: Payer: MEDICARE | Admitting: Vascular Surgery

## 2012-10-09 ENCOUNTER — Other Ambulatory Visit: Payer: Self-pay

## 2012-10-09 DIAGNOSIS — I83893 Varicose veins of bilateral lower extremities with other complications: Secondary | ICD-10-CM

## 2012-10-17 ENCOUNTER — Encounter: Payer: Self-pay | Admitting: Vascular Surgery

## 2012-10-18 ENCOUNTER — Encounter (INDEPENDENT_AMBULATORY_CARE_PROVIDER_SITE_OTHER): Payer: MEDICARE | Admitting: *Deleted

## 2012-10-18 ENCOUNTER — Encounter: Payer: Self-pay | Admitting: Vascular Surgery

## 2012-10-18 ENCOUNTER — Ambulatory Visit (INDEPENDENT_AMBULATORY_CARE_PROVIDER_SITE_OTHER): Payer: MEDICARE | Admitting: Vascular Surgery

## 2012-10-18 VITALS — BP 138/69 | HR 85 | Resp 22 | Ht 70.0 in | Wt 215.0 lb

## 2012-10-18 DIAGNOSIS — I872 Venous insufficiency (chronic) (peripheral): Secondary | ICD-10-CM

## 2012-10-18 DIAGNOSIS — I8310 Varicose veins of unspecified lower extremity with inflammation: Secondary | ICD-10-CM

## 2012-10-18 DIAGNOSIS — R609 Edema, unspecified: Secondary | ICD-10-CM

## 2012-10-18 DIAGNOSIS — R600 Localized edema: Secondary | ICD-10-CM

## 2012-10-18 DIAGNOSIS — I83009 Varicose veins of unspecified lower extremity with ulcer of unspecified site: Secondary | ICD-10-CM

## 2012-10-18 NOTE — Progress Notes (Signed)
[facility]  Referred by:  Wanda Plump, MD [phone] [facility] [address] [place], [place] [address]  Reason for referral: Swollen bilateral leg  History of Present Illness  Dale Wong is a 78 y.o. (1927-08-02) male who presents with chief complaint: B swollen leg.  Patient notes, onset of swelling 12 months ago, associated with no trigger.  The patient has had no history of DVT, known history of varicose vein, no history of venous stasis ulcers, no history of  Lymphedema and known history of skin changes in lower legs.  There is a family history of venous disorders.  The patient has used compression stockings in the past, resulting in skin slough.  Patient has been under a Dermatologist's care for 12 months, during which time he has significant improvement in his swelling and skin changes with compression and steroid cream.  The patient denies any pain.  Previous pruritius and swelling noted.  Past Medical History  Diagnosis Date  . Hyperlipidemia   . Colon cancer     Last colonoscopy November 2011,  . Fatigue   . Parkinson disease     Sees neurology in [place]  . Depression   . Dementia     sees neurology in [place]    Past Surgical History  Procedure Date  . Spinal fusion     back surgery for spinal stenosis  . Shoulder arthroscopy 4/11    s/p  . Colectomy     History   Social History  . Marital Status: Married    Spouse Name: N/A    Number of Children: N/A  . Years of Education: N/A   Occupational History  . Not on file.   Social History Main Topics  . Smoking status: Never Smoker   . Smokeless tobacco: Never Used  . Alcohol Use: No  . Drug Use: No  . Sexually Active: Not on file   Other Topics Concern  . Not on file   Social History Narrative   Lives w/ girlfriend-partnerADL independent so farStill drives    Family History  Problem Relation Age of Onset  . Liver cancer Brother   . Diabetes Neg Hx   . Coronary artery  disease Neg Hx     Current Outpatient Prescriptions on File Prior to Visit  Medication Sig Dispense Refill  . aspirin 81 MG tablet Take 81 mg by mouth daily.        . Calcium Carbonate-Vitamin D (CALTRATE 600+D) 600-400 MG-UNIT per chew tablet Chew 1 tablet by mouth daily.        . carbidopa-levodopa-entacapone (STALEVO) 12.5-50-200 MG per tablet Take 1 tablet by mouth 4 (four) times daily.        . citalopram (CELEXA) 20 MG tablet Take 10 mg by mouth daily.        Marland Kitchen doxycycline (VIBRAMYCIN) 100 MG capsule Take 100 mg by mouth 2 (two) times daily.      . entacapone (COMTAN) 200 MG tablet Take 200 mg by mouth 4 (four) times daily.        . fish oil-omega-3 fatty acids 1000 MG capsule Take 2 g by mouth daily.        . furosemide (LASIX) 20 MG tablet One tablet every day, On Monday Wednesday and Fridays take 2 tablets daily  60 tablet  3  . ketoconazole (NIZORAL) 2 % cream Apply topically daily.  60 g  0  . Memantine HCl (NAMENDA PO) Take 2 tablets  by mouth daily.        . Multiple Vitamins-Minerals (CENTRUM SILVER PO) Take by mouth.        . pravastatin (PRAVACHOL) 20 MG tablet Take 20 mg by mouth daily.      . rivastigmine (EXELON) 4.6 mg/24hr Place 1 patch onto the skin daily.        . rotigotine (NEUPRO) 2 MG/24HR Place 1 patch onto the skin daily.      . vitamin C (ASCORBIC ACID) 500 MG tablet Take 500 mg by mouth daily.        Marland Kitchen zoster vaccine live, PF, (ZOSTAVAX) 16109 UNT/0.65ML injection Inject 19,400 Units into the skin once.  1 each  0    No Known Allergies  REVIEW OF SYSTEMS:  (Positives checked otherwise negative)  CARDIOVASCULAR:  [ ]  chest pain, [ ]  chest pressure, [ ]  palpitations, [ ]  shortness of breath when laying flat, [ ]  shortness of breath with exertion,   [ ]  pain in feet when walking, [x]  pain in feet when laying flat, [ ]  history of blood clot in veins (DVT), [ ]  history of phlebitis, [x]  swelling in legs, [ ]  varicose veins  PULMONARY:  [ ]  productive cough, [ ]   asthma, [ ]  wheezing  NEUROLOGIC:  [x]  weakness in arms or legs, [ ]  numbness in arms or legs, [ ]  difficulty speaking or slurred speech, [ ]  temporary loss of vision in one eye, [ ]  dizziness  HEMATOLOGIC:  [ ]  bleeding problems, [ ]  problems with blood clotting too easily  MUSCULOSKEL:  [ ]  joint pain, [ ]  joint swelling  GASTROINTEST:  [ ]   Vomiting blood, [ ]   Blood in stool     GENITOURINARY:  [ ]   Burning with urination, [ ]   Blood in urine  PSYCHIATRIC:  [ ]  history of major depression  INTEGUMENTARY:  [x]  rashes, [ ]  ulcers  CONSTITUTIONAL:  [ ]  fever, [ ]  chills  Physical Examination  Filed Vitals:   10/18/12 1435  BP: 138/69  Pulse: 85  Resp: 22  Height: 5\' 10"  (1.778 m)  Weight: 215 lb (97.523 kg)   Body mass index is 30.85 kg/(m^2).  General: A&O x 3, WDWN  Head: [place]/AT  Ear/Nose/Throat: Hearing grossly intact, nares w/o erythema or drainage, oropharynx w/o Erythema/Exudate  Eyes: PERRLA, EOMI  Neck: Supple, no nuchal rigidity, no palpable LAD  Pulmonary: Sym exp, good air movt, CTAB, no rales, rhonchi, & wheezing  Cardiac: RRR, Nl S1, S2, no Murmurs, rubs or gallops  Vascular: Vessel Right Left  Radial Palpable Palpable  Ulnar Palpable Palpable  Brachial Palpable Palpable  Carotid Palpable, without bruit Palpable, without bruit  Aorta Not palpable N/A  Femoral Palpable Palpable  Popliteal Not palpable Not palpable  PT Faintly Palpable Faintly Palpable  DP Palpable Palpable   Gastrointestinal: soft, NTND, -G/R, - HSM, - masses, - CVAT B  Musculoskeletal: M/S 5/5 throughout , Extremities without ischemic changes , BLE LDS, 1+ edema, L lateral leg suspicious for previous healed ulcer  Neurologic: CN 2-12 intact , Pain and light touch intact in extremities , Motor exam as listed above  Psychiatric: Judgment intact, Mood & affect appropriate for pt's clinical situation  Dermatologic: See M/S exam for extremity exam, no rashes otherwise  noted  Lymph : No Cervical, Axillary, or Inguinal lymphadenopathy   Non-Invasive Vascular Imaging  BLE Venous Insufficiency Duplex (Date: 10/18/12):   RLE: No DVT, superficial and deep reflux  LLE: No DVT, superficial and  deep reflux  Outside Studies/Documentation 5 pages of outside documents were reviewed including: outpatient Dermatology.  Medical Decision Making  Dale Wong is a 78 y.o. male who presents with: BLE Chronic venous insufficiency (C4).   Based on the patient's history and examination, I recommend: BLE thigh high compression stockings 20-30 mm Hg.  The patient is reluctant to use the stocking due to complication from previous use, skin slough.  He is going to continue with compressive therapy at his nursing home.   I will refer him to the [facility] Clinic to see if they think he a candidate for EVLA GSV.  Thank you for allowing [place] to participate in this patient's care.  Leonides Sake, MD [facility] Office: [phone] Pager: [phone]  10/18/2012, 3:13 PM

## 2012-10-21 ENCOUNTER — Encounter: Payer: Self-pay | Admitting: Vascular Surgery

## 2012-10-22 ENCOUNTER — Other Ambulatory Visit: Payer: Self-pay | Admitting: *Deleted

## 2012-10-22 ENCOUNTER — Ambulatory Visit (INDEPENDENT_AMBULATORY_CARE_PROVIDER_SITE_OTHER): Payer: MEDICARE | Admitting: Vascular Surgery

## 2012-10-22 ENCOUNTER — Encounter: Payer: Self-pay | Admitting: Vascular Surgery

## 2012-10-22 VITALS — BP 112/65 | HR 74 | Resp 20 | Ht 70.0 in | Wt 215.0 lb

## 2012-10-22 DIAGNOSIS — I872 Venous insufficiency (chronic) (peripheral): Secondary | ICD-10-CM

## 2012-10-22 NOTE — Progress Notes (Signed)
Subjective:     Patient ID: Dale Wong, male   DOB: December 21, 1927, 78 y.o.   MRN: [id number]  HPI this 78 year old male was seen by Dr. Standley Brooking on 1025 for severe venous insufficiency both lower extremities. He was found to have gross reflux in both great saphenous veins and both deep venous systems. Patient is currently living in a nursing facility for the past 12 days. He does have Alzheimer's. He has no history of stasis ulcers according to his wife. He is elevating his legs at night on a wedge but not wearing elastic compression stockings. Legs are being wrapped on occasion.  Review of Systems     Objective:   Physical ExamBP 112/65  Pulse 74  Resp 20  Ht 5\' 10"  (1.778 m)  Wt 215 lb (97.523 kg)  BMI 30.85 kg/m2  Gen. chronically ill-appearing elderly male in no apparent distress Lower extremities severe hyperpigmentation lower half the lower third of both lower extremities with chronic 2+ edema and multiple eschars but no active ulcers. 2+ dorsalis pedis pulses palpable.     Assessment:     Severe venous insufficiency bilaterally with superficial and deep reflux and severe skin changes due to venous hypertension    Plan:     #1 long-leg elastic compression stockings 20-30 mm gradient #2 elevate foot of bed at night and have stockings placed first thing in the a.m. #3 ibuprofen for pain #4 return in 3 months. I think patient would benefit likely from staged bilateral laser ablation great saphenous veins beginning with the left side. This will not solve his problem because of the deep venous reflux but may assist in stabilizing his skin if he is diligent in elevation and elastic compression

## 2012-10-24 ENCOUNTER — Encounter (INDEPENDENT_AMBULATORY_CARE_PROVIDER_SITE_OTHER): Payer: MEDICARE

## 2012-10-24 DIAGNOSIS — I83893 Varicose veins of bilateral lower extremities with other complications: Secondary | ICD-10-CM

## 2012-11-04 ENCOUNTER — Encounter: Payer: Self-pay | Admitting: Gastroenterology

## 2012-11-04 ENCOUNTER — Ambulatory Visit (INDEPENDENT_AMBULATORY_CARE_PROVIDER_SITE_OTHER): Payer: MEDICARE | Admitting: Gastroenterology

## 2012-11-04 VITALS — BP 98/60 | HR 60 | Wt 215.0 lb

## 2012-11-04 DIAGNOSIS — D649 Anemia, unspecified: Secondary | ICD-10-CM | POA: Insufficient documentation

## 2012-11-04 NOTE — Progress Notes (Signed)
History of Present Illness:  78 year old white male with history of colon cancer, Parkinson's disease and dementia referred at the request of Dr. [name] for evaluation of anemia.  In July, 2011 hemoglobin was 14.6. In September, 2013 it was 11.8. RBC indices were normal. Platelet count was 117. The patient has no GI complaints including change of bowel habits, abdominal pain, melena or hematochezia. He denies dysphagia or pyrosis. Last colonoscopy in 2011 was remarkable only for scattered diverticula.    Past Medical History  Diagnosis Date  . Hyperlipidemia   . Colon cancer     Last colonoscopy November 2011,  . Fatigue   . Parkinson disease     Sees neurology in [place]  . Depression   . Dementia     sees neurology in [place]  . Alcohol abuse    Past Surgical History  Procedure Date  . Spinal fusion     back surgery for spinal stenosis  . Shoulder arthroscopy 4/11    s/p  . Colectomy    family history includes Liver cancer in his brother.  There is no history of Diabetes and Coronary artery disease. Current Outpatient Prescriptions  Medication Sig Dispense Refill  . aspirin 81 MG tablet Take 81 mg by mouth daily.        . Calcium Carbonate-Vitamin D (CALTRATE 600+D) 600-400 MG-UNIT per chew tablet Chew 1 tablet by mouth daily.        . carbidopa-levodopa-entacapone (STALEVO) 12.5-50-200 MG per tablet Take 1 tablet by mouth 4 (four) times daily.        . citalopram (CELEXA) 20 MG tablet Take 10 mg by mouth daily.        . entacapone (COMTAN) 200 MG tablet Take 200 mg by mouth 4 (four) times daily.        . fish oil-omega-3 fatty acids 1000 MG capsule Take 2 g by mouth daily.        . furosemide (LASIX) 20 MG tablet One tablet every day, On Monday Wednesday and Fridays take 2 tablets daily  60 tablet  3  . ketoconazole (NIZORAL) 2 % cream Apply topically daily.  60 g  0  . Memantine HCl (NAMENDA PO) Take 2 tablets by mouth daily.        . Multiple Vitamins-Minerals (CENTRUM SILVER PO) Take by  mouth.        . pravastatin (PRAVACHOL) 20 MG tablet Take 20 mg by mouth daily.      . rivastigmine (EXELON) 4.6 mg/24hr Place 1 patch onto the skin daily.        . rotigotine (NEUPRO) 2 MG/24HR Place 1 patch onto the skin daily.      . vitamin B-12 (CYANOCOBALAMIN) 500 MCG tablet Take 500 mcg by mouth daily.      . vitamin C (ASCORBIC ACID) 500 MG tablet Take 500 mg by mouth daily.         Allergies as of 11/04/2012  . (No Known Allergies)    reports that he has never smoked. He has never used smokeless tobacco. He reports that he does not drink alcohol or use illicit drugs.     Review of Systems: Pertinent positive and negative review of systems were noted in the above HPI section. All other review of systems were otherwise negative.  Vital signs were reviewed in today's medical record Physical Exam: General: Well developed , well nourished, no acute distress Head: Normocephalic and atraumatic Eyes:  sclerae anicteric, EOMI Ears: Normal auditory acuity Mouth: No  deformity or lesions Neck: Supple, no masses or thyromegaly Lungs: Clear throughout to auscultation Heart: Regular rate and rhythm; no murmurs, rubs or bruits Abdomen: Soft, non tender and non distended. No masses, hepatosplenomegaly or hernias noted. Normal Bowel sounds Rectal:deferred Musculoskeletal: Symmetrical with no gross deformities  Skin: No lesions on visible extremities Pulses:  Normal pulses noted Extremities: No clubbing, cyanosis, edema or deformities noted Neurological: Alert oriented x 4, grossly nonfocal Cervical Nodes:  No significant cervical adenopathy Inguinal Nodes: No significant inguinal adenopathy Psychological:  Alert and cooperative. Normal mood and affect

## 2012-11-04 NOTE — Patient Instructions (Addendum)
Upper GI Endoscopy Upper GI endoscopy means using a flexible scope to look at the esophagus, stomach, and upper small bowel. This is done to make a diagnosis in people with heartburn, abdominal pain, or abnormal bleeding. Sometimes an endoscope is needed to remove foreign bodies or food that become stuck in the esophagus; it can also be used to take biopsy samples. For the best results, do not eat or drink for 8 hours before having your upper endoscopy.  To perform the endoscopy, you will probably be sedated and your throat will be numbed with a special spray. The endoscope is then slowly passed down your throat (this will not interfere with your breathing). An endoscopy exam takes 15 to 30 minutes to complete and there is no real pain. Patients rarely remember much about the procedure. The results of the test may take several days if a biopsy or other test is taken.  You may have a sore throat after an endoscopy exam. Serious complications are very rare. Stick to liquids and soft foods until your pain is better. Do not drive a car or operate any dangerous equipment for at least 24 hours after being sedated. SEEK IMMEDIATE MEDICAL CARE IF:   You have severe throat pain.   You have shortness of breath.   You have bleeding problems.   You have a fever.   You have difficulty recovering from your sedation.  Document Released: 01/18/2005 Document Revised: 11/30/2011 Document Reviewed: 12/13/2008 Exit[employer] Patient Information 2012 [employer]. 

## 2012-11-04 NOTE — Assessment & Plan Note (Signed)
Patient appears to have a anemia of chronic disease. There is no history suggestive of  GI blood loss.  Relatively recent colonoscopy mitigates against a lower GI bleeding source.  Recommendations #1 upper endoscopy #2 Hemoccults

## 2012-11-05 ENCOUNTER — Ambulatory Visit (AMBULATORY_SURGERY_CENTER): Payer: MEDICARE | Admitting: Gastroenterology

## 2012-11-05 ENCOUNTER — Encounter: Payer: Self-pay | Admitting: Gastroenterology

## 2012-11-05 VITALS — BP 168/87 | HR 65 | Temp 96.4°F | Resp 20 | Ht 70.0 in | Wt 215.0 lb

## 2012-11-05 DIAGNOSIS — D1330 Benign neoplasm of unspecified part of small intestine: Secondary | ICD-10-CM

## 2012-11-05 DIAGNOSIS — K269 Duodenal ulcer, unspecified as acute or chronic, without hemorrhage or perforation: Secondary | ICD-10-CM

## 2012-11-05 DIAGNOSIS — D649 Anemia, unspecified: Secondary | ICD-10-CM

## 2012-11-05 MED ORDER — FAMOTIDINE 40 MG PO TABS: 40.0000 mg | ORAL_TABLET | Freq: Every day | ORAL | Status: DC

## 2012-11-05 MED ORDER — SODIUM CHLORIDE 0.9 % IV SOLN: 500.0000 mL | INTRAVENOUS | Status: DC

## 2012-11-05 NOTE — Progress Notes (Signed)
Patient did not experience any of the following events: a burn prior to discharge; a fall within the facility; wrong site/side/patient/procedure/implant event; or a hospital transfer or hospital admission upon discharge from the facility. (G8907) Patient did not have preoperative order for IV antibiotic SSI prophylaxis. (G8918)  

## 2012-11-05 NOTE — Patient Instructions (Addendum)
YOU HAD AN ENDOSCOPIC PROCEDURE TODAY AT THE [facility]: Refer to the procedure report that was given to you for any specific questions about what was found during the examination.  If the procedure report does not answer your questions, please call your gastroenterologist to clarify.  If you requested that your care partner not be given the details of your procedure findings, then the procedure report has been included in a sealed envelope for you to review at your convenience later.  YOU SHOULD EXPECT: Some feelings of bloating in the abdomen. Passage of more gas than usual.  Walking can help get rid of the air that was put into your GI tract during the procedure and reduce the bloating. If you had a lower endoscopy (such as a colonoscopy or flexible sigmoidoscopy) you may notice spotting of blood in your stool or on the toilet paper. If you underwent a bowel prep for your procedure, then you may not have a normal bowel movement for a few days.  DIET: Your first meal following the procedure should be a light meal and then it is ok to progress to your normal diet.  A half-sandwich or bowl of soup is an example of a good first meal.  Heavy or fried foods are harder to digest and may make you feel nauseous or bloated.  Likewise meals heavy in dairy and vegetables can cause extra gas to form and this can also increase the bloating.  Drink plenty of fluids but you should avoid alcoholic beverages for 24 hours.  ACTIVITY: Your care partner should take you home directly after the procedure.  You should plan to take it easy, moving slowly for the rest of the day.  You can resume normal activity the day after the procedure however you should NOT DRIVE or use heavy machinery for 24 hours (because of the sedation medicines used during the test).    SYMPTOMS TO REPORT IMMEDIATELY: A gastroenterologist can be reached at any hour.  During normal business hours, 8:30 AM to 5:00 PM Monday through Friday,  call [phone].  After hours and on weekends, please call the GI answering service at [phone] who will take a message and have the physician on call contact you.   Following lower endoscopy (colonoscopy or flexible sigmoidoscopy):  Excessive amounts of blood in the stool  Significant tenderness or worsening of abdominal pains  Swelling of the abdomen that is new, acute  Fever of 100F or higher  Following upper endoscopy (EGD)  Vomiting of blood or coffee ground material  New chest pain or pain under the shoulder blades  Painful or persistently difficult swallowing  New shortness of breath  Fever of 100F or higher  Black, tarry-looking stools  FOLLOW UP: If any biopsies were taken you will be contacted by phone or by letter within the next 1-3 weeks.  Call your gastroenterologist if you have not heard about the biopsies in 3 weeks.  Our staff will call the home number listed on your records the next business day following your procedure to check on you and address any questions or concerns that you may have at that time regarding the information given to you following your procedure. This is a courtesy call and so if there is no answer at the home number and we have not heard from you through the emergency physician on call, we will assume that you have returned to your regular daily activities without incident.  SIGNATURES/CONFIDENTIALITY: You and/or your care   partner have signed paperwork which will be entered into your electronic medical record.  These signatures attest to the fact that that the information above on your After Visit Summary has been reviewed and is understood.  Full responsibility of the confidentiality of this discharge information lies with you and/or your care-partner.  

## 2012-11-05 NOTE — Op Note (Signed)
[facility] [address] [place], [address]   ENDOSCOPY PROCEDURE REPORT  PATIENT: Dale, Wong  MR#: [id number] BIRTHDATE: 01/06/1927 , 85  yrs. old GENDER: Male ENDOSCOPIST: Louis Meckel, MD REFERRED BY: PROCEDURE DATE:  11/05/2012 PROCEDURE:  EGD w/ biopsy ASA CLASS:     Class III INDICATIONS:  anemia. MEDICATIONS: MAC sedation, administered by CRNA and Propofol (Diprivan) 130 mg IV TOPICAL ANESTHETIC:  DESCRIPTION OF PROCEDURE: After the risks benefits and alternatives of the procedure were thoroughly explained, informed consent was obtained.  The LB [id number]-H180 [id number] endoscope was introduced through the mouth and advanced to the third portion of the duodenum. Without limitations.  The instrument was slowly withdrawn as the mucosa was fully examined.      There was a 2 mm superficial ulcer overlying a fold in the second portion of the duodenum just ampulla.  Biopsies were taken.  There was no fresh or old blood. A large periampullary diverticulum was noted.   There was a 2 mm superficial ulcer overlying a fold in the second portion of the duodenum just ampulla.  Biopsies were taken.  There was no fresh or old blood. A large periampullary diverticulum was noted.   The remainder of the upper endoscopy exam was otherwise normal.  Retroflexed views revealed no abnormalities.     The scope was then withdrawn from the patient and the procedure completed.  COMPLICATIONS: There were no complications. ENDOSCOPIC IMPRESSION: 1.   There was a 2 mm superficial ulcer overlying a fold in the second portion of the duodenum just ampulla.  Biopsies were taken. There was no fresh or old blood. A large periampullary diverticulum was noted. 2.   The remainder of the upper endoscopy exam was otherwise normal  The findings on endoscopy are not absolutely diagnostic for a GI bleeding source  RECOMMENDATIONS: 1.   begin Pepcid 40 mg daily 2.   serial  Hemoccults 3.   await biopsy findings.  REPEAT EXAM:  eSigned:  Louis Meckel, MD 11/05/2012 5:10 PM   UX:LKGM [name], MD and Bishop Limbo MD  PATIENT NAME:  Dale, Wong MR#: [id number]

## 2012-11-06 ENCOUNTER — Telehealth: Payer: Self-pay | Admitting: Internal Medicine

## 2012-11-06 ENCOUNTER — Telehealth: Payer: Self-pay | Admitting: *Deleted

## 2012-11-06 NOTE — Telephone Encounter (Signed)
Ms. Dale Wong called stating the nursing home needs Dr. [name] to give written instruction for them to be able to give him his fluid pill. Ms. Dale Wong will come pick this letter up when ready.

## 2012-11-06 NOTE — Telephone Encounter (Signed)
Please advise 

## 2012-11-06 NOTE — Telephone Encounter (Signed)
  Follow up Call-  Call back number 11/05/2012  Post procedure Call Back phone  # [facility] Nursing [phone],  [name], friend  Permission to leave phone message Yes     Patient questions:  Do you have a fever, pain , or abdominal swelling? no Pain Score  0 *  Have you tolerated food without any problems? yes  Have you been able to return to your normal activities? yes  Do you have any questions about your discharge instructions: Diet   no Medications  no Follow up visit  no  Do you have questions or concerns about your Care? no  Actions: * If pain score is 4 or above: No action needed, pain <4.

## 2012-11-06 NOTE — Telephone Encounter (Signed)
Please print our latest medication list and provide it to Mrs. Joesphine Bare

## 2012-11-06 NOTE — Telephone Encounter (Signed)
Left detailed msg letting Mrs. Dale Wong know med list is ready to be picked up.

## 2012-11-19 ENCOUNTER — Encounter: Payer: Self-pay | Admitting: Gastroenterology

## 2012-12-11 ENCOUNTER — Observation Stay (HOSPITAL_COMMUNITY)
Admission: EM | Admit: 2012-12-11 | Discharge: 2012-12-13 | Disposition: A | Discharge status: Home / Self Care | Payer: MEDICARE | Attending: Internal Medicine | Admitting: Internal Medicine

## 2012-12-11 ENCOUNTER — Emergency Department (HOSPITAL_COMMUNITY): Payer: MEDICARE

## 2012-12-11 ENCOUNTER — Encounter (HOSPITAL_COMMUNITY): Payer: Self-pay | Admitting: *Deleted

## 2012-12-11 DIAGNOSIS — Z79899 Other long term (current) drug therapy: Secondary | ICD-10-CM | POA: Insufficient documentation

## 2012-12-11 DIAGNOSIS — C189 Malignant neoplasm of colon, unspecified: Secondary | ICD-10-CM

## 2012-12-11 DIAGNOSIS — D649 Anemia, unspecified: Secondary | ICD-10-CM

## 2012-12-11 DIAGNOSIS — I359 Nonrheumatic aortic valve disorder, unspecified: Secondary | ICD-10-CM

## 2012-12-11 DIAGNOSIS — F3289 Other specified depressive episodes: Secondary | ICD-10-CM

## 2012-12-11 DIAGNOSIS — D696 Thrombocytopenia, unspecified: Secondary | ICD-10-CM | POA: Insufficient documentation

## 2012-12-11 DIAGNOSIS — I739 Peripheral vascular disease, unspecified: Secondary | ICD-10-CM

## 2012-12-11 DIAGNOSIS — E785 Hyperlipidemia, unspecified: Secondary | ICD-10-CM

## 2012-12-11 DIAGNOSIS — Y92009 Unspecified place in unspecified non-institutional (private) residence as the place of occurrence of the external cause: Secondary | ICD-10-CM | POA: Insufficient documentation

## 2012-12-11 DIAGNOSIS — R55 Syncope and collapse: Secondary | ICD-10-CM

## 2012-12-11 DIAGNOSIS — M549 Dorsalgia, unspecified: Secondary | ICD-10-CM | POA: Insufficient documentation

## 2012-12-11 DIAGNOSIS — R339 Retention of urine, unspecified: Secondary | ICD-10-CM | POA: Insufficient documentation

## 2012-12-11 DIAGNOSIS — R413 Other amnesia: Secondary | ICD-10-CM

## 2012-12-11 DIAGNOSIS — I872 Venous insufficiency (chronic) (peripheral): Secondary | ICD-10-CM

## 2012-12-11 DIAGNOSIS — R269 Unspecified abnormalities of gait and mobility: Secondary | ICD-10-CM

## 2012-12-11 DIAGNOSIS — F0390 Unspecified dementia without behavioral disturbance: Secondary | ICD-10-CM

## 2012-12-11 DIAGNOSIS — F0280 Dementia in other diseases classified elsewhere without behavioral disturbance: Secondary | ICD-10-CM | POA: Insufficient documentation

## 2012-12-11 DIAGNOSIS — R5383 Other fatigue: Secondary | ICD-10-CM

## 2012-12-11 DIAGNOSIS — R5381 Other malaise: Secondary | ICD-10-CM

## 2012-12-11 DIAGNOSIS — L0291 Cutaneous abscess, unspecified: Secondary | ICD-10-CM

## 2012-12-11 DIAGNOSIS — W19XXXA Unspecified fall, initial encounter: Secondary | ICD-10-CM

## 2012-12-11 DIAGNOSIS — S32000A Wedge compression fracture of unspecified lumbar vertebra, initial encounter for closed fracture: Secondary | ICD-10-CM

## 2012-12-11 DIAGNOSIS — I83009 Varicose veins of unspecified lower extremity with ulcer of unspecified site: Secondary | ICD-10-CM

## 2012-12-11 DIAGNOSIS — F329 Major depressive disorder, single episode, unspecified: Secondary | ICD-10-CM

## 2012-12-11 DIAGNOSIS — L0390 Cellulitis, unspecified: Secondary | ICD-10-CM

## 2012-12-11 DIAGNOSIS — B351 Tinea unguium: Secondary | ICD-10-CM

## 2012-12-11 DIAGNOSIS — T148XXA Other injury of unspecified body region, initial encounter: Secondary | ICD-10-CM

## 2012-12-11 DIAGNOSIS — L02419 Cutaneous abscess of limb, unspecified: Secondary | ICD-10-CM | POA: Insufficient documentation

## 2012-12-11 DIAGNOSIS — R627 Adult failure to thrive: Secondary | ICD-10-CM

## 2012-12-11 DIAGNOSIS — S32019A Unspecified fracture of first lumbar vertebra, initial encounter for closed fracture: Secondary | ICD-10-CM

## 2012-12-11 DIAGNOSIS — E860 Dehydration: Secondary | ICD-10-CM

## 2012-12-11 DIAGNOSIS — R1310 Dysphagia, unspecified: Secondary | ICD-10-CM | POA: Insufficient documentation

## 2012-12-11 DIAGNOSIS — G20 Parkinson's disease: Secondary | ICD-10-CM | POA: Insufficient documentation

## 2012-12-11 DIAGNOSIS — S32009A Unspecified fracture of unspecified lumbar vertebra, initial encounter for closed fracture: Principal | ICD-10-CM

## 2012-12-11 DIAGNOSIS — L03119 Cellulitis of unspecified part of limb: Secondary | ICD-10-CM | POA: Insufficient documentation

## 2012-12-11 DIAGNOSIS — I369 Nonrheumatic tricuspid valve disorder, unspecified: Secondary | ICD-10-CM

## 2012-12-11 DIAGNOSIS — R609 Edema, unspecified: Secondary | ICD-10-CM

## 2012-12-11 DIAGNOSIS — G20A1 Parkinson's disease without dyskinesia, without mention of fluctuations: Secondary | ICD-10-CM

## 2012-12-11 LAB — URINALYSIS, MICROSCOPIC ONLY
Bilirubin Urine: NEGATIVE
Glucose, UA: NEGATIVE mg/dL
Ketones, ur: 15 mg/dL — AB
pH: 6 (ref 5.0–8.0)

## 2012-12-11 LAB — CBC
HCT: 38.1 % — ABNORMAL LOW (ref 39.0–52.0)
Hemoglobin: 12.8 g/dL — ABNORMAL LOW (ref 13.0–17.0)
MCV: 88.4 fL (ref 78.0–100.0)
Platelets: 124 10*3/uL — ABNORMAL LOW (ref 150–400)
RBC: 4.31 MIL/uL (ref 4.22–5.81)
RBC: 4.14 MIL/uL — ABNORMAL LOW (ref 4.22–5.81)
WBC: 7.3 10*3/uL (ref 4.0–10.5)
WBC: 6.5 10*3/uL (ref 4.0–10.5)

## 2012-12-11 LAB — CREATININE, SERUM
Creatinine, Ser: 0.83 mg/dL (ref 0.50–1.35)
GFR calc Af Amer: >90 mL/min (ref >90)

## 2012-12-11 LAB — BASIC METABOLIC PANEL
BUN: 24 mg/dL — ABNORMAL HIGH (ref 6–23)
CO2: 25 mEq/L (ref 19–32)
Chloride: 101 mEq/L (ref 96–112)
Creatinine, Ser: 0.98 mg/dL (ref 0.50–1.35)
Glucose, Bld: 100 mg/dL — ABNORMAL HIGH (ref 70–99)

## 2012-12-11 LAB — PRO B NATRIURETIC PEPTIDE: Pro B Natriuretic peptide (BNP): 567.4 pg/mL — ABNORMAL HIGH (ref 0–450)

## 2012-12-11 LAB — CK TOTAL AND CKMB (NOT AT ARMC)
CK, MB: 3.1 ng/mL (ref 0.3–4.0)
Relative Index: INVALID (ref 0.0–2.5)
Total CK: 78 U/L (ref 7–232)

## 2012-12-11 LAB — C-REACTIVE PROTEIN: CRP: 1.5 mg/dL — ABNORMAL HIGH (ref <0.60)

## 2012-12-11 MED ORDER — CARBIDOPA-LEVODOPA 25-100 MG PO TABS
0.5000 | ORAL_TABLET | Freq: Four times a day (QID) | ORAL | Status: DC
  Administered 2012-12-11 – 2012-12-13 (×8): 0.5 via ORAL
  Filled 2012-12-11 (×14): qty 0.5

## 2012-12-11 MED ORDER — MEMANTINE HCL 5 MG PO TABS
5.0000 mg | ORAL_TABLET | Freq: Every day | ORAL | Status: DC
  Administered 2012-12-11 – 2012-12-13 (×3): 5 mg via ORAL
  Filled 2012-12-11 (×3): qty 1

## 2012-12-11 MED ORDER — ENTACAPONE 200 MG PO TABS
200.0000 mg | ORAL_TABLET | Freq: Four times a day (QID) | ORAL | Status: DC
  Administered 2012-12-11 – 2012-12-13 (×8): 200 mg via ORAL
  Filled 2012-12-11 (×14): qty 1

## 2012-12-11 MED ORDER — RIVASTIGMINE 9.5 MG/24HR TD PT24: 9.5000 mg | MEDICATED_PATCH | Freq: Every day | TRANSDERMAL | Status: DC

## 2012-12-11 MED ORDER — ROTIGOTINE 2 MG/24HR TD PT24: 1.0000 | MEDICATED_PATCH | Freq: Every day | TRANSDERMAL | Status: DC

## 2012-12-11 MED ORDER — ONDANSETRON HCL 4 MG PO TABS: 4.0000 mg | ORAL_TABLET | Freq: Four times a day (QID) | ORAL | Status: DC | PRN

## 2012-12-11 MED ORDER — CYANOCOBALAMIN 500 MCG PO TABS
500.0000 ug | ORAL_TABLET | Freq: Every day | ORAL | Status: DC
  Administered 2012-12-11 – 2012-12-13 (×3): 500 ug via ORAL
  Filled 2012-12-11 (×3): qty 1

## 2012-12-11 MED ORDER — RIVASTIGMINE 4.6 MG/24HR TD PT24
4.6000 mg | MEDICATED_PATCH | Freq: Every day | TRANSDERMAL | Status: DC
  Administered 2012-12-11: 4.6 mg via TRANSDERMAL
  Filled 2012-12-11: qty 1

## 2012-12-11 MED ORDER — ONDANSETRON HCL 4 MG/2ML IJ SOLN: 4.0000 mg | Freq: Four times a day (QID) | INTRAMUSCULAR | Status: DC | PRN

## 2012-12-11 MED ORDER — ROTIGOTINE 2 MG/24HR TD PT24
1.0000 | MEDICATED_PATCH | TRANSDERMAL | Status: DC
  Administered 2012-12-11 – 2012-12-12 (×2): 1 via TRANSDERMAL

## 2012-12-11 MED ORDER — ACETAMINOPHEN 650 MG RE SUPP: 650.0000 mg | Freq: Four times a day (QID) | RECTAL | Status: DC | PRN

## 2012-12-11 MED ORDER — VITAMIN C 500 MG PO TABS
500.0000 mg | ORAL_TABLET | Freq: Every day | ORAL | Status: DC
  Administered 2012-12-11 – 2012-12-13 (×3): 500 mg via ORAL
  Filled 2012-12-11 (×3): qty 1

## 2012-12-11 MED ORDER — FUROSEMIDE 40 MG PO TABS
40.0000 mg | ORAL_TABLET | Freq: Every day | ORAL | Status: DC
  Administered 2012-12-11 – 2012-12-13 (×3): 40 mg via ORAL
  Filled 2012-12-11 (×3): qty 1

## 2012-12-11 MED ORDER — VITAMIN D3 25 MCG (1000 UNIT) PO TABS
1000.0000 [IU] | ORAL_TABLET | Freq: Every day | ORAL | Status: DC
  Administered 2012-12-11 – 2012-12-13 (×3): 1000 [IU] via ORAL
  Filled 2012-12-11 (×3): qty 1

## 2012-12-11 MED ORDER — ACETAMINOPHEN 325 MG PO TABS: 650.0000 mg | ORAL_TABLET | Freq: Four times a day (QID) | ORAL | Status: DC | PRN

## 2012-12-11 MED ORDER — SODIUM CHLORIDE 0.9 % IJ SOLN: 3.0000 mL | Freq: Two times a day (BID) | INTRAMUSCULAR | Status: DC

## 2012-12-11 MED ORDER — HYDROMORPHONE HCL PF 1 MG/ML IJ SOLN
1.0000 mg | INTRAMUSCULAR | Status: DC | PRN
  Administered 2012-12-11 – 2012-12-12 (×3): 1 mg via INTRAVENOUS
  Filled 2012-12-11 (×3): qty 1

## 2012-12-11 MED ORDER — CALCIUM CARBONATE-VITAMIN D 500-200 MG-UNIT PO TABS
1.0000 | ORAL_TABLET | Freq: Every day | ORAL | Status: DC
  Administered 2012-12-11 – 2012-12-13 (×3): 1 via ORAL
  Filled 2012-12-11 (×3): qty 1

## 2012-12-11 MED ORDER — RIVASTIGMINE 13.3 MG/24HR TD PT24
13.3000 mg | MEDICATED_PATCH | TRANSDERMAL | Status: DC
  Administered 2012-12-11: 13.3 mg via TRANSDERMAL
  Filled 2012-12-11: qty 1

## 2012-12-11 MED ORDER — VITAMIN B-12 500 MCG PO TABS: 500.0000 ug | ORAL_TABLET | Freq: Every day | ORAL | Status: DC

## 2012-12-11 MED ORDER — CARBIDOPA-LEVODOPA-ENTACAPONE 12.5-50-200 MG PO TABS: 1.0000 | ORAL_TABLET | Freq: Four times a day (QID) | ORAL | Status: DC

## 2012-12-11 MED ORDER — CEPHALEXIN 500 MG PO CAPS
500.0000 mg | ORAL_CAPSULE | Freq: Three times a day (TID) | ORAL | Status: DC
  Administered 2012-12-11 – 2012-12-13 (×6): 500 mg via ORAL
  Filled 2012-12-11 (×9): qty 1

## 2012-12-11 MED ORDER — SODIUM CHLORIDE 0.9 % IV SOLN
Freq: Once | INTRAVENOUS | Status: AC
  Administered 2012-12-11: 07:00:00 via INTRAVENOUS

## 2012-12-11 MED ORDER — DOXYCYCLINE HYCLATE 100 MG PO TABS
100.0000 mg | ORAL_TABLET | Freq: Two times a day (BID) | ORAL | Status: DC
  Administered 2012-12-11 – 2012-12-13 (×5): 100 mg via ORAL
  Filled 2012-12-11 (×6): qty 1

## 2012-12-11 MED ORDER — CALCIUM CARBONATE-VITAMIN D 600-400 MG-UNIT PO CHEW: 1.0000 | CHEWABLE_TABLET | Freq: Every day | ORAL | Status: DC

## 2012-12-11 MED ORDER — ASPIRIN EC 81 MG PO TBEC
81.0000 mg | DELAYED_RELEASE_TABLET | Freq: Every day | ORAL | Status: DC
  Administered 2012-12-11 – 2012-12-13 (×3): 81 mg via ORAL
  Filled 2012-12-11 (×3): qty 1

## 2012-12-11 MED ORDER — HYDROCODONE-ACETAMINOPHEN 5-325 MG PO TABS
1.0000 | ORAL_TABLET | ORAL | Status: DC | PRN
  Filled 2012-12-11: qty 1

## 2012-12-11 MED ORDER — ASPIRIN 81 MG PO TABS: 81.0000 mg | ORAL_TABLET | Freq: Every day | ORAL | Status: DC

## 2012-12-11 MED ORDER — SODIUM CHLORIDE 0.9 % IV SOLN
INTRAVENOUS | Status: AC
  Administered 2012-12-11: 13:00:00 via INTRAVENOUS

## 2012-12-11 MED ORDER — FAMOTIDINE 40 MG PO TABS
40.0000 mg | ORAL_TABLET | Freq: Every day | ORAL | Status: DC
  Administered 2012-12-11 – 2012-12-13 (×3): 40 mg via ORAL
  Filled 2012-12-11 (×3): qty 1

## 2012-12-11 MED ORDER — SIMVASTATIN 10 MG PO TABS
10.0000 mg | ORAL_TABLET | Freq: Every day | ORAL | Status: DC
  Administered 2012-12-11 – 2012-12-12 (×2): 10 mg via ORAL
  Filled 2012-12-11 (×3): qty 1

## 2012-12-11 MED ORDER — CITALOPRAM HYDROBROMIDE 10 MG PO TABS
10.0000 mg | ORAL_TABLET | Freq: Every day | ORAL | Status: DC
  Administered 2012-12-11 – 2012-12-13 (×3): 10 mg via ORAL
  Filled 2012-12-11 (×3): qty 1

## 2012-12-11 MED ORDER — RIVASTIGMINE 13.3 MG/24HR TD PT24
13.3000 mg | MEDICATED_PATCH | TRANSDERMAL | Status: DC
  Administered 2012-12-12: 13.3 mg via TRANSDERMAL

## 2012-12-11 MED ORDER — SODIUM CHLORIDE 0.9 % IV BOLUS (SEPSIS)
500.0000 mL | Freq: Once | INTRAVENOUS | Status: AC
  Administered 2012-12-11: 500 mL via INTRAVENOUS

## 2012-12-11 MED ORDER — ALUM & MAG HYDROXIDE-SIMETH 200-200-20 MG/5ML PO SUSP: 30.0000 mL | Freq: Four times a day (QID) | ORAL | Status: DC | PRN

## 2012-12-11 MED ORDER — ENOXAPARIN SODIUM 40 MG/0.4ML ~~LOC~~ SOLN
40.0000 mg | SUBCUTANEOUS | Status: DC
  Administered 2012-12-11 – 2012-12-12 (×2): 40 mg via SUBCUTANEOUS
  Filled 2012-12-11 (×3): qty 0.4

## 2012-12-11 NOTE — Progress Notes (Signed)
*  PRELIMINARY RESULTS* Echocardiogram 2D Echocardiogram has been performed.  Jeryl Columbia 12/11/2012, 2:26 PM

## 2012-12-11 NOTE — Progress Notes (Signed)
Girlfriend who is also POA is at bedside at this time; she states dose of exelon patch is incorrect; at home pt has 13.3mg  patch; will page MD.

## 2012-12-11 NOTE — Progress Notes (Signed)
Initial order for exelon patch was 4.6mg ; home exelon patch removed from back earlier; only 1 patch present; new patch applied at that time; girlfriend arrives with home dose exelon patch which is 13.3 in addition to another patch; neither patch is supplied by [facility] pharmacy; pharmacist in room to clarify dosages; new home dose patches applied at this time; girlfriend states that 2 patches were on pts back this AM, although only 1 patch was found on admission to 2000; will cont. To monitor.

## 2012-12-11 NOTE — ED Provider Notes (Signed)
History     CSN: [id number]  Arrival date & time 12/11/12  0559   First MD Initiated Contact with Patient 12/11/12 [phone]      Chief Complaint  Patient presents with  . Urinary Retention    (Consider location/radiation/quality/duration/timing/severity/associated sxs/prior treatment) HPI Comments: Dale Wong is a 78 y.o. male with a history of dementia and Parkinson presents to the emergency department with a significant other who is the primary historian.  Patient level V caveat due to baseline dementia.  Patient currently living in a nursing home, however he has had a short leave to stay at home from Tuesday through Thursday in his friend's care.  Her significant other nursing home reported an unwitnessed fall that occurred Monday evening and patient has been complaining of back pain, headaches, and calf pain ever since.  He should is not on blood thinners.  In addition patient has not had any urination since yesterday around 1600.  Patient has had decreased fluid intake. Pt can ambulate with walker at baseline. No other information reported at this time, however when patient is asked about headache, back pain, calf pain, abdominal pain or suprapubic pressure he denies all of the above.  The history is provided by medical records, a friend and the patient. The history is limited by the condition of the patient.    Past Medical History  Diagnosis Date  . Hyperlipidemia   . Fatigue   . Parkinson disease     Sees neurology in [place]  . Depression   . Dementia     sees neurology in [place]  . Alcohol abuse   . Colon cancer     Last colonoscopy November 2011,    Past Surgical History  Procedure Date  . Spinal fusion     back surgery for spinal stenosis  . Shoulder arthroscopy 4/11    s/p  . Colectomy     Family History  Problem Relation Age of Onset  . Liver cancer Brother   . Diabetes Neg Hx   . Coronary artery disease Neg Hx     History  Substance Use Topics  . Smoking  status: Never Smoker   . Smokeless tobacco: Never Used  . Alcohol Use: No      Review of Systems  Unable to perform ROS: Dementia    Allergies  Review of patient's allergies indicates no known allergies.  Home Medications   Current Outpatient Rx  Name  Route  Sig  Dispense  Refill  . ASPIRIN 81 MG PO TABS   Oral   Take 81 mg by mouth daily.           Marland Kitchen CALCIUM CARBONATE-VITAMIN D 600-400 MG-UNIT PO CHEW   Oral   Chew 1 tablet by mouth daily.           Marland Kitchen CARBIDOPA-LEVODOPA-ENTACAPONE 12.5-50-200 MG PO TABS   Oral   Take 1 tablet by mouth 4 (four) times daily.           Marland Kitchen VITAMIN D 1000 UNITS PO TABS   Oral   Take 1,000 Units by mouth daily.         Marland Kitchen CITALOPRAM HYDROBROMIDE 20 MG PO TABS   Oral   Take 10 mg by mouth daily.           Marland Kitchen ENTACAPONE 200 MG PO TABS   Oral   Take 200 mg by mouth 4 (four) times daily.           Marland Kitchen  FAMOTIDINE 40 MG PO TABS   Oral   Take 1 tablet (40 mg total) by mouth daily.   30 tablet   2   . OMEGA-3 FATTY ACIDS 1000 MG PO CAPS   Oral   Take 2 g by mouth daily.           . FUROSEMIDE 20 MG PO TABS      One tablet every day, On Monday Wednesday and Fridays take 2 tablets daily   60 tablet   3   . KETOCONAZOLE 2 % EX CREA   Topical   Apply topically daily.   60 g   0   . NAMENDA PO   Oral   Take 2 tablets by mouth daily.           . CENTRUM SILVER PO   Oral   Take by mouth.           Marland Kitchen PRAVASTATIN SODIUM 20 MG PO TABS   Oral   Take 20 mg by mouth daily.         Marland Kitchen RIVASTIGMINE 4.6 MG/24HR TD PT24   Transdermal   Place 1 patch onto the skin daily.           Marland Kitchen ROTIGOTINE 2 MG/24HR TD PT24   Transdermal   Place 1 patch onto the skin daily.         Marland Kitchen VITAMIN B-12 500 MCG PO TABS   Oral   Take 500 mcg by mouth daily.         Marland Kitchen VITAMIN C 500 MG PO TABS   Oral   Take 500 mg by mouth daily.             BP 126/74  Pulse 70  Temp 99.1 F (37.3 C) (Oral)  Resp 16  Ht 5\' 10"   (1.778 m)  Wt 210 lb (95.255 kg)  BMI 30.13 kg/m2  SpO2 98%  Physical Exam  Nursing note and vitals reviewed. Constitutional: He appears well-developed and well-nourished. No distress.  HENT:  Head: Normocephalic and atraumatic.  Eyes: Conjunctivae normal and EOM are normal. Pupils are equal, round, and reactive to light. No scleral icterus.  Neck: Normal range of motion. Neck supple.  Cardiovascular: Normal rate, regular rhythm, normal heart sounds and intact distal pulses.        Mild 1+ edema bilaterally   Pulmonary/Chest: Effort normal. No respiratory distress. He has no wheezes. He has no rales. He exhibits no tenderness.  Abdominal: Soft. There is no tenderness.  Musculoskeletal: He exhibits no edema and no tenderness.       Pt w normal movement of all extremities  and denies bony tenderness throughout, question ability to report pain  Neurological: He is alert. He displays a negative Romberg sign.       No facial paralysis or slurring of speech, patient is alert and oriented to self. Unable to asses cranial nerves d/t pts inability to follow commands.    Skin: Skin is warm and dry. No petechiae, no purpura and no rash noted. He is not diaphoretic.       Skin changes c/w vasculitis in lower extremities bilaterally.   Psychiatric: He has a normal mood and affect. His behavior is normal. Thought content normal. His speech is not slurred. Cognition and memory are impaired. He exhibits abnormal recent memory. He exhibits normal remote memory.    ED Course  Procedures (including critical care time)  Labs Reviewed  CBC - Abnormal; Notable for the  following:    Hemoglobin 12.8 (*)     HCT 38.1 (*)     Platelets 125 (*)     All other components within normal limits  BASIC METABOLIC PANEL - Abnormal; Notable for the following:    Glucose, Bld 100 (*)     BUN 24 (*)     GFR calc non Af Amer 74 (*)     GFR calc Af Amer 86 (*)     All other components within normal limits   URINALYSIS, MICROSCOPIC ONLY - Abnormal; Notable for the following:    Ketones, ur 15 (*)     All other components within normal limits  URINE CULTURE   Dg Lumbar Spine Complete  12/11/2012  *RADIOLOGY REPORT*  Clinical Data: Low back pain after fall  LUMBAR SPINE - COMPLETE 4+ VIEW  Comparison: None.  Findings: Bones are diffusely demineralized.  There is convex leftward scoliosis of the lumbar spine. Superior plate compression deformity noted at L1 resulting and 25-50% loss of height anteriorly and less than 25% loss of height posteriorly.  No radiographic findings to suggest significant posterior bony retropulsion of fragments into the spinal canal.  Marked loss of disc height is seen at L4-5.  There is lower lumbar facet degeneration bilaterally.  IMPRESSION: Superior endplate compression fracture at L1 has imaging features which could certainly be acute.  Degenerative changes in the lower lumbar spine.   Original Report Authenticated By: Kennith Center, M.D.    Dg Pelvis 1-2 Views  12/11/2012  *RADIOLOGY REPORT*  Clinical Data: Fall.  Urinary retention.  Low back pain.  PELVIS - 1-2 VIEW  Comparison: 07/14/2010  Findings: Bones are diffusely demineralized.  No evidence for acute fracture. Degenerative changes noted in the right hip.  SI joints and symphysis pubis are unremarkable.  IMPRESSION: No acute bony findings.  Degenerative changes in the right hip.   Original Report Authenticated By: Kennith Center, M.D.    Ct Head Wo Contrast  12/11/2012  *RADIOLOGY REPORT*  Clinical Data: Fall.  Diffuse body pain and stiffness.  CT HEAD WITHOUT CONTRAST  Technique:  Contiguous axial images were obtained from the base of the skull through the vertex without contrast.  Comparison: None.  Findings: There is no evidence for acute hemorrhage, hydrocephalus, mass lesion, or abnormal extra-axial fluid collection.  No definite CT evidence for acute infarction.  Diffuse loss of parenchymal volume is consistent  with atrophy. Prominence of the lateral ventricles with relative sparing of the temporal horns suggest prominent central component to the atrophy. Patchy low attenuation in the deep hemispheric and periventricular white matter is nonspecific, but likely reflects chronic microvascular ischemic demyelination.  Volume averaging with a dural or venous calcification in the high right frontal region noted.  The visualized paranasal sinuses and mastoid air cells are clear.  IMPRESSION: No acute intracranial abnormality.  Advanced atrophy with chronic microvascular ischemic white matter demyelination.   Original Report Authenticated By: Kennith Center, M.D.    9:45 AM - Patient's records reviewed with his last echo in 2011 showing moderate aortic stenosis. Patient's primary care provider Dr. [name] has been consulted d/t concern that patient may have had a syncopal event as etiology of unwitnessed fall that occurred at the nursing home earlier this week. Per PCP patient is to be admitted for observation by [facility].  No diagnosis found.   MDM  #1 Unwitnessed fall, question possible syncope- patient be admitted for observation and syncope workup #2 superior endplate compression fracture, at L1- lumbar  corset ordered #3 urinary retention- Foley catheter placed with 100 cc removed from bladder, UA wnl, culture pending #4 dehydration- IV fluids given  The patient appears reasonably stabilized for admission considering the current resources, flow, and capabilities available in the ED at this time, and I doubt any other [facility] requiring further screening and/or treatment in the ED prior to admission. Pt seen with Dr. Rhunette Croft who agrees with the above treatment plan        Jaci Carrel, [place] 12/11/12 [phone]

## 2012-12-11 NOTE — ED Notes (Signed)
Spoke with Brooke Bonito Tech and relayed order of lumbar corsett; Victorino Dike will place the order and should be to the unit within the hour; RN notified

## 2012-12-11 NOTE — Progress Notes (Signed)
Due to dementia it was requested by charge RN that pt be moved to camera room; girlfriend not wanting this to be done; will keep pt in 2035.

## 2012-12-11 NOTE — ED Notes (Signed)
Pt back in room from being out of the department; pt placed on continuous pulse oximetry and blood pressure cuff; family at bedside

## 2012-12-11 NOTE — Progress Notes (Signed)
Orthopedic Tech Progress Note Patient Details:  Dale Wong Mar 14, 1927 [id number]  Patient ID: Dale Wong, male   DOB: Oct 11, 1927, 78 y.o.   MRN: [id number]   [name] 12/11/2012, 9:18 AM CALLED BIO [facility]TECH FOR LUMBAR CORSET.

## 2012-12-11 NOTE — ED Provider Notes (Signed)
Medical screening examination/treatment/procedure(s) were conducted as a shared visit with non-physician practitioner(s) and myself.  I personally evaluated the patient during the encounter  Derwood Kaplan, MD 12/11/12 1725

## 2012-12-11 NOTE — Progress Notes (Signed)
Orthopedic Tech Progress Note Patient Details:  Dale Wong 02-09-1927 [id number]  Patient ID: Dale Wong, male   DOB: Oct 03, 1927, 78 y.o.   MRN: [id number]   Dale Wong 12/11/2012, 10:15 AM  LUMBAR CORSET COMPLETED BY [facility]IO[facility]TE[facility].

## 2012-12-11 NOTE — H&P (Signed)
History and Physical       Hospital Admission Note Date: 12/11/2012  Patient name: Dale Wong Medical record number: [id number] Date of birth: 01-Aug-1927 Age: 78 y.o. Gender: male PCP: Willow Ora, MD    Chief Complaint:  Unwitnessed fall  HPI: Patient is 78 year old male with history of dementia, gait instability, Parkinson's disease, currently resident of skilled nursing facility had unwitnessed fall on Monday evening 2 days ago. Since then patient had been complaining of back pain and headache. Due to dementia and Parkinson's, patient is not able to give much history. He had poor by mouth intake in the last few days. He ambulates with walker at baseline.  In the ED, patient was found to have L1 compression fracture. Right Lower leg has blisters with bilateral increased erythema for last 3 weeks per his friend accompanying him in the ED. He is followed by Dr Hart Rochester (vascular surgery) for PVD. His PCP, Dr. [name] was contacted by EDP who recommended admission for observation and 2-D echocardiogram.   Review of Systems:  Unable to obtain from the patient due to dementia   Past Medical History: Past Medical History  Diagnosis Date  . Hyperlipidemia   . Fatigue   . Parkinson disease     Sees neurology in [place]  . Depression   . Dementia     sees neurology in [place]  . Alcohol abuse   . Colon cancer     Last colonoscopy November 2011,   Past Surgical History  Procedure Date  . Spinal fusion     back surgery for spinal stenosis  . Shoulder arthroscopy 4/11    s/p  . Colectomy     Medications: Prior to Admission medications   Medication Sig Start Date End Date Taking? Authorizing Provider  aspirin 81 MG tablet Take 81 mg by mouth daily.      Historical Provider, MD  Calcium Carbonate-Vitamin D (CALTRATE 600+D) 600-400 MG-UNIT per chew tablet Chew 1 tablet by mouth daily.      Historical Provider, MD  carbidopa-levodopa-entacapone  (STALEVO) 12.5-50-200 MG per tablet Take 1 tablet by mouth 4 (four) times daily.      Historical Provider, MD  cholecalciferol (VITAMIN D) 1000 UNITS tablet Take 1,000 Units by mouth daily.    Historical Provider, MD  citalopram (CELEXA) 20 MG tablet Take 10 mg by mouth daily.      Historical Provider, MD  entacapone (COMTAN) 200 MG tablet Take 200 mg by mouth 4 (four) times daily.      Historical Provider, MD  famotidine (PEPCID) 40 MG tablet Take 1 tablet (40 mg total) by mouth daily. 11/05/12   Louis Meckel, MD  fish oil-omega-3 fatty acids 1000 MG capsule Take 2 g by mouth daily.      Historical Provider, MD  furosemide (LASIX) 20 MG tablet One tablet every day, On Monday Wednesday and Fridays take 2 tablets daily 08/23/12   Wanda Plump, MD  ketoconazole (NIZORAL) 2 % cream Apply topically daily. 05/01/12 05/01/13  Wanda Plump, MD  Memantine HCl (NAMENDA PO) Take 2 tablets by mouth daily.      Historical Provider, MD  Multiple Vitamins-Minerals (CENTRUM SILVER PO) Take by mouth.      Historical Provider, MD  pravastatin (PRAVACHOL) 20 MG tablet Take 20 mg by mouth daily.    Historical Provider, MD  rivastigmine (EXELON) 4.6 mg/24hr Place 1 patch onto the skin daily.      Historical Provider, MD  rotigotine (NEUPRO) 2  MG/24HR Place 1 patch onto the skin daily.    Historical Provider, MD  vitamin B-12 (CYANOCOBALAMIN) 500 MCG tablet Take 500 mcg by mouth daily.    Historical Provider, MD  vitamin C (ASCORBIC ACID) 500 MG tablet Take 500 mg by mouth daily.      Historical Provider, MD    Allergies:  No Known Allergies  Social History:  reports that he has never smoked. He has never used smokeless tobacco. He reports that he does not drink alcohol or use illicit drugs. he's currently resident of skilled nursing facility   Family History: Family History  Problem Relation Age of Onset  . Liver cancer Brother   . Diabetes Neg Hx   . Coronary artery disease Neg Hx     Physical Exam: Blood  pressure 126/74, pulse 70, temperature 98.5 F (36.9 C), temperature source Oral, resp. rate 16, height 5\' 10"  (1.778 m), weight 95.255 kg (210 lb), SpO2 98.00%. General: Alert, awake, but keeps his eyes closed during encounter, in no acute distress. HEENT: normocephalic, atraumatic, anicteric sclera, pink conjunctiva, pupils equal and reactive to light and accomodation, oropharynx clear Neck: supple, no masses or lymphadenopathy, no goiter, no bruits  Heart: Regular rate and rhythm, without murmurs, rubs or gallops. Lungs: Clear to auscultation bilaterally, no wheezing, rales or rhonchi. Abdomen: Soft, nontender, nondistended, positive bowel sounds, no masses. Extremities: No clubbing, cyanosis. 2+ edema with erythema,  stasis dermatitis, blistering on the right lower leg  Neuro: Grossly intact, no focal neurological deficits, strength 5/5 upper and lower extremities bilaterally Psych: Dementia, normal mood and affect Skin: no rashes or lesions, warm and dry   LABS on Admission:  Basic Metabolic Panel:  Lab 12/11/12 9811  NA 135  K 3.9  CL 101  CO2 25  GLUCOSE 100*  BUN 24*  CREATININE 0.98  CALCIUM 8.9  MG --  PHOS --   CBC:  Lab 12/11/12 0626  WBC 7.3  NEUTROABS --  HGB 12.8*  HCT 38.1*  MCV 88.4  PLT 125*     Radiological Exams on Admission: Dg Lumbar Spine Complete  12/11/2012  *RADIOLOGY REPORT*  Clinical Data: Low back pain after fall  LUMBAR SPINE - COMPLETE 4+ VIEW  Comparison: None.  Findings: Bones are diffusely demineralized.  There is convex leftward scoliosis of the lumbar spine. Superior plate compression deformity noted at L1 resulting and 25-50% loss of height anteriorly and less than 25% loss of height posteriorly.  No radiographic findings to suggest significant posterior bony retropulsion of fragments into the spinal canal.  Marked loss of disc height is seen at L4-5.  There is lower lumbar facet degeneration bilaterally.  IMPRESSION: Superior endplate  compression fracture at L1 has imaging features which could certainly be acute.  Degenerative changes in the lower lumbar spine.   Original Report Authenticated By: Kennith Center, M.D.    Dg Pelvis 1-2 Views  12/11/2012  *RADIOLOGY REPORT*  Clinical Data: Fall.  Urinary retention.  Low back pain.  PELVIS - 1-2 VIEW  Comparison: 07/14/2010  Findings: Bones are diffusely demineralized.  No evidence for acute fracture. Degenerative changes noted in the right hip.  SI joints and symphysis pubis are unremarkable.  IMPRESSION: No acute bony findings.  Degenerative changes in the right hip.   Original Report Authenticated By: Kennith Center, M.D.    Ct Head Wo Contrast  12/11/2012  *RADIOLOGY REPORT*  Clinical Data: Fall.  Diffuse body pain and stiffness.  CT HEAD WITHOUT CONTRAST  Technique:  Contiguous axial images were obtained from the base of the skull through the vertex without contrast.  Comparison: None.  Findings: There is no evidence for acute hemorrhage, hydrocephalus, mass lesion, or abnormal extra-axial fluid collection.  No definite CT evidence for acute infarction.  Diffuse loss of parenchymal volume is consistent with atrophy. Prominence of the lateral ventricles with relative sparing of the temporal horns suggest prominent central component to the atrophy. Patchy low attenuation in the deep hemispheric and periventricular white matter is nonspecific, but likely reflects chronic microvascular ischemic demyelination.  Volume averaging with a dural or venous calcification in the high right frontal region noted.  The visualized paranasal sinuses and mastoid air cells are clear.  IMPRESSION: No acute intracranial abnormality.  Advanced atrophy with chronic microvascular ischemic white matter demyelination.   Original Report Authenticated By: Kennith Center, M.D.     Assessment/Plan Principal Problem:  *Syncope versus unwitnessed fall -  Admit for observation to telemetry, rule out acute ACS, repeat 2-D  echocardiogram. Prior 2-D echo in 2011 showed mild to moderate aortic stenosis, valve area 1.61cm2, EF 65-70% - PT, OT eval   Active Problems:  PARKINSON'S DISEASE / GAIT DISTURBANCE:  - PTOT evaluation   Dermatitis, stasis with Peripheral vascular disease and possible cellulitis of the lower extremity - Per patient's friend, the erythema has worsened in the last 3 weeks with blistering - He was previously recommended wraps but not doing it at SNF, check ESR, CRP - place on doxycycline and keflex   Adult failure to thrive with Dementia -UA showed no UTI, cont fall precautions   L1 vertebral fracture/ fall -Brace provided by ortho tech, pain control  - Placed on fall precautions  DVT prophylaxis: lovenox  CODE STATUS: FULL CODE (discussed with patient's significant other Dale Wong  HPOA)   Further plan will depend as patient's clinical course evolves and further radiologic and laboratory data become available.   Time Spent on Admission:  1 hour   Napoleon Monacelli M.D. [facility] 12/11/2012, 12:14 PM Pager: [phone]  If 7PM-7AM, please contact night-coverage [email or web address] Password [email or web address]

## 2012-12-11 NOTE — ED Notes (Signed)
Went and spoke with [place], [place] with some concerns the family of the pt has

## 2012-12-11 NOTE — ED Notes (Signed)
Urine slip sent to lab; lab tech [name]) will add urine test on

## 2012-12-11 NOTE — ED Notes (Signed)
Ortho tech was paged, awaiting return call

## 2012-12-11 NOTE — ED Notes (Signed)
Pt still out of the department for a procedure 

## 2012-12-11 NOTE — ED Notes (Signed)
PT TRANSPORTED TO RADIOLOGY.

## 2012-12-11 NOTE — Progress Notes (Signed)
New orders entered for exelon patch

## 2012-12-11 NOTE — ED Notes (Signed)
Pt with hx of dementia and parkinsons to ED with wife stating he has been unable to urinate since 1600 yesterday.  Pt denies suprapubic pain, but per wife, pt fell the other day while at nursing home and hurt his back.

## 2012-12-12 ENCOUNTER — Telehealth: Payer: Self-pay | Admitting: Internal Medicine

## 2012-12-12 DIAGNOSIS — I359 Nonrheumatic aortic valve disorder, unspecified: Secondary | ICD-10-CM

## 2012-12-12 LAB — BASIC METABOLIC PANEL
CO2: 25 mEq/L (ref 19–32)
Chloride: 102 mEq/L (ref 96–112)
Creatinine, Ser: 0.89 mg/dL (ref 0.50–1.35)
Glucose, Bld: 89 mg/dL (ref 70–99)

## 2012-12-12 LAB — URINE CULTURE
Colony Count: NO GROWTH
Colony Count: NO GROWTH
Culture: NO GROWTH
Culture: NO GROWTH

## 2012-12-12 LAB — CBC
HCT: 36.9 % — ABNORMAL LOW (ref 39.0–52.0)
MCH: 29.1 pg (ref 26.0–34.0)
MCV: 88.1 fL (ref 78.0–100.0)
RBC: 4.19 MIL/uL — ABNORMAL LOW (ref 4.22–5.81)
RDW: 15.1 % (ref 11.5–15.5)
WBC: 6.9 10*3/uL (ref 4.0–10.5)

## 2012-12-12 LAB — CK TOTAL AND CKMB (NOT AT ARMC): Total CK: 75 U/L (ref 7–232)

## 2012-12-12 LAB — TROPONIN I: Troponin I: <0.3 ng/mL (ref <0.30)

## 2012-12-12 MED ORDER — POTASSIUM CHLORIDE CRYS ER 20 MEQ PO TBCR
20.0000 meq | EXTENDED_RELEASE_TABLET | Freq: Once | ORAL | Status: AC
  Administered 2012-12-12: 20 meq via ORAL
  Filled 2012-12-12: qty 1

## 2012-12-12 NOTE — Telephone Encounter (Signed)
Ms. Joesphine Bare states the patient needs referral to cardiologist after his hospital stay. She would like him to go to [facility]. Please call her back at home and ok to leave a message. She will be at the hospital all evening with the patient.

## 2012-12-12 NOTE — Evaluation (Signed)
Physical Therapy Evaluation Patient Details Name: Dale Wong MRN: [id number] DOB: [birth year] Today's Date: 12/12/2012 Time: 1478-2956 PT Time Calculation (min): 25 min  PT Assessment / Plan / Recommendation Clinical Impression  Pt admitted from [facility] after fall with L1 compression fx. Pt girlfriend present majority of session to provide PLOF. Per girlfriend pt tends to wander at night and can be lethargic in the morning after all activity at night. Pt will benefit from acute therapy to maximize mobility, gait, function and safety as well as educate pt for back brace and precautions.     PT Assessment  Patient needs continued PT services    Follow Up Recommendations  Supervision/Assistance - 24 hour;Home health PT;SNF (pending supervisory ability of ALF vs SNF)    Does the patient have the potential to tolerate intense rehabilitation      Barriers to Discharge Decreased caregiver support      Equipment Recommendations  None recommended by PT    Recommendations for Other Services OT consult   Frequency Min 3X/week    Precautions / Restrictions Precautions Precautions: Fall;Back Precaution Booklet Issued: Yes (comment) Precaution Comments: back handout provided and explained to girlfriend Required Braces or Orthoses: Spinal Brace Spinal Brace: Lumbar corset;Applied in sitting position Restrictions Weight Bearing Restrictions: No   Pertinent Vitals/Pain No pain reported      Mobility  Bed Mobility Bed Mobility: Rolling Right;Right Sidelying to Sit;Sitting - Scoot to [place] of Bed Rolling Right: 2: Max assist Right Sidelying to Sit: 2: Max assist;HOB flat Sitting - Scoot to [place] of Bed: 4: Min assist Details for Bed Mobility Assistance: max assist to get pt to initiate and perform bed mobility, pt with improved alertness and participation once seated although eyes remained closed, brace donned in sitting and pt unable to assist Transfers Transfers: Sit to  Stand;Stand to Sit;Stand Pivot Transfers Sit to Stand: 4: Min assist;From bed Stand to Sit: 4: Min assist;To bed;To chair/3-in-1 Stand Pivot Transfers: 3: Mod assist;With armrests Details for Transfer Assistance: Pt with increased time to initiate and participate. Girlfriend actually able to cue pt to stand first trial min assist but after 20sec pt sat back on bed. 2nd attempt able to stand and pivot to chair with use of RW with multimodal cues for trnasfers andsafety with hand over hand placement to armrests to sit. pt able to scoot back in chair with verbal and tactile cues. Ambulation/Gait Ambulation/Gait Assistance: Not tested (comment)    Shoulder Instructions     Exercises     PT Diagnosis: Difficulty walking;Altered mental status  PT Problem List: Decreased balance;Decreased mobility;Decreased activity tolerance;Decreased cognition;Decreased knowledge of use of DME;Decreased knowledge of precautions PT Treatment Interventions: DME instruction;Gait training;Functional mobility training;Therapeutic activities;Patient/family education   PT Goals Acute Rehab PT Goals PT Goal Formulation: With patient/family Time For Goal Achievement: 12/26/12 Potential to Achieve Goals: Fair Pt will go Supine/Side to Sit: with supervision PT Goal: Supine/Side to Sit - Progress: Goal set today Pt will go Sit to Supine/Side: with supervision PT Goal: Sit to Supine/Side - Progress: Goal set today Pt will go Sit to Stand: with supervision PT Goal: Sit to Stand - Progress: Goal set today Pt will go Stand to Sit: with supervision PT Goal: Stand to Sit - Progress: Goal set today Pt will Ambulate: with min assist;51 - 150 feet;with least restrictive assistive device PT Goal: Ambulate - Progress: Goal set today Additional Goals Additional Goal #1: pt will state 2/3 back precautions and demonstrate adherence to them  with supervision PT Goal: Additional Goal #1 - Progress: Goal set today  Visit  Information  Last PT Received On: 12/12/12 Assistance Needed: +1    Subjective Data  Subjective: "don't let me fall" Patient Stated Goal: pt unable to state, girlfriend wants to get him back to ALF   Prior Functioning  Home Living Lives With: Other (Comment) Available Help at Discharge: Personal care attendant Type of Home: Assisted living Home Access: Level entry Home Layout: One level Bathroom Toilet: Handicapped height Home Adaptive Equipment: Environmental consultant - four wheeled Prior Function Level of Independence: Needs assistance Needs Assistance: Bathing;Dressing;Meal Prep;Light Housekeeping Bath: Moderate Dressing: Moderate Meal Prep: Total Light Housekeeping: Total Able to Take Stairs?: No Driving: No Vocation: Retired Comments: Per girlfriend pt gets up and down to bathroom and all around ALF wandering with rollator but has to have assist for ADLs. Per girlfriend he has been at [facility] since [place] but was living with her for 11yrs prior to that Communication Communication: HOH    Cognition  Overall Cognitive Status: Impaired Area of Impairment: Attention Arousal/Alertness: Lethargic Orientation Level: Disoriented to;Situation;Time;Place Behavior During Session: Lethargic Current Attention Level: Focused Cognition - Other Comments: Pt remains eyes closed majority of session despite mobility, sternal rub and cueing. In sitting and standing pt still with eyes closed but improved ability following commands upright than in supine.    Extremity/Trunk Assessment Right Upper Extremity Assessment RUE ROM/Strength/Tone: Deficits;Unable to fully assess;Due to impaired cognition Left Upper Extremity Assessment LUE ROM/Strength/Tone: Deficits;Unable to fully assess;Due to impaired cognition Right Lower Extremity Assessment RLE ROM/Strength/Tone: Deficits;Unable to fully assess;Due to impaired cognition RLE ROM/Strength/Tone Deficits: grossly 2+/5 unable to get pt to follow commands to  further assess RLE Sensation: Deficits Left Lower Extremity Assessment LLE ROM/Strength/Tone: Deficits;Unable to fully assess;Due to impaired cognition LLE ROM/Strength/Tone Deficits: grossly 2+/5 unable to get pt to follow commands to further assess Trunk Assessment Trunk Assessment: Kyphotic   Balance Static Sitting Balance Static Sitting - Balance Support: Feet supported;Right upper extremity supported Static Sitting - Level of Assistance: 5: Stand by assistance Static Sitting - Comment/# of Minutes: 5  End of Session PT - End of Session Equipment Utilized During Treatment: Gait belt;Back brace Activity Tolerance: Patient limited by fatigue Patient left: in chair;with call bell/phone within reach;with nursing in room;with family/visitor present Nurse Communication: Mobility status       Delorse Lek 12/12/2012, 10:18 AM  Delaney Meigs, PT [phone]

## 2012-12-12 NOTE — Clinical Social Work Psychosocial (Signed)
Clinical Social Work Department BRIEF PSYCHOSOCIAL ASSESSMENT 12/12/2012  Patient:  Dale Wong, Dale Wong     Account Number:  [id number]     Admit date:  12/11/2012  Clinical Social Worker:  Thomasene Mohair  Date/Time:  12/12/2012 01:00 PM  Referred by:  RN  Date Referred:  12/12/2012 Referred for  ALF Placement   Other Referral:   from [facility]F   Interview type:  Patient Other interview type:   long-term girlfriend/partner of 21 years    PSYCHOSOCIAL DATA Living Status:  FACILITY Admitted from facility:  [facility] Level of care:  Assisted Living Primary support name:  [name] [phone] Primary support relationship to patient:  PARTNER Degree of support available:   strong support    CURRENT CONCERNS Current Concerns  Post-Acute Placement   Other Concerns:    SOCIAL WORK ASSESSMENT / PLAN CSW was referred to Pt to assist with dc planning. Pt lives at ALF [facility], he has been there since mid-October. Prior to living in ALF, Pt lived with long-term partner who managed his health care needs at home.  Pt's partner had planned to take Pt out of ALF for the holidays and has decided to take Pt home at dc.  Pt's partner does not want CSW to notify ALF of any of the hospital treatment at this time.  Pt's partner stated that she would like home health to work with Pt at her home and that she will update the ALF with any care changes once he returns.  CSW notified MD and CM. CSW can assist with Pt's transportation home if needed at dc.   Assessment/plan status:  Psychosocial Support/Ongoing Assessment of Needs Other assessment/ plan:   Information/referral to community resources:   Information for [employer].    PATIENT'S/FAMILY'S RESPONSE TO PLAN OF CARE: Pt was comfortable in bed, did not engage in conversation but was listening and answered questions when asked directly.  Pt's long-time partner is very supportive of Pt and acts as his advocate. CSW  provided a lot of support and encouragement to partner for the work she is doing to find appropriate living situation for Pt in the long-term.  Pt's partner verbalized appreciation for CSW's time.   Frederico Hamman, LCSW [phone]

## 2012-12-12 NOTE — Telephone Encounter (Signed)
Please advise 

## 2012-12-12 NOTE — Evaluation (Signed)
Occupational Therapy Evaluation Patient Details Name: Dale Wong MRN: [id number] DOB: [birth year] Today's Date: 12/12/2012 Time: 7846-9629 OT Time Calculation (min): 57 min  OT Assessment / Plan / Recommendation Clinical Impression  78 yr old male with history of dementia, admitted after fall resulting in L1 compression fracture.  Feel pt will benefit from acute care OT to help increase overall strength, balance, and independence with functional transfers.  Feel he will need 24 hour assist at discharge and continued OT rehab.  Will depend on level of assist [facility] can provide.  May need SNF for follow-up rehab.    OT Assessment  Patient needs continued OT Services    Follow Up Recommendations  SNF;Other (comment) (SNF if  ALF cannot provide 24 hour physical assist.)       Equipment Recommendations  None recommended by OT       Frequency  Min 2X/week    Precautions / Restrictions Precautions Precautions: Fall;Back Precaution Booklet Issued: Yes (comment) Precaution Comments: back handout provided and explained to girlfriend Required Braces or Orthoses: Spinal Brace Spinal Brace: Applied in sitting position;Lumbar corset Restrictions Weight Bearing Restrictions: No   Pertinent Vitals/Pain No report of pain    ADL  Eating/Feeding: Simulated;Minimal assistance;Other (comment) (girlfriedn reports helping him secondary to lethargy) Where Assessed - Eating/Feeding: Chair Grooming: Performed;Moderate assistance Where Assessed - Grooming: Supported standing Upper Body Bathing: Simulated;Set up Where Assessed - Upper Body Bathing: Supported sitting Lower Body Bathing: Simulated;Moderate assistance Where Assessed - Lower Body Bathing: Supported sit to stand Upper Body Dressing: Simulated;Minimal assistance Where Assessed - Upper Body Dressing: Unsupported sitting Lower Body Dressing: Simulated;Maximal assistance Where Assessed - Lower Body Dressing: Supported sit to  stand Toilet Transfer: Performed;Moderate assistance Toilet Transfer Method: Stand pivot Toilet Transfer Equipment: Bedside commode Toileting - Clothing Manipulation and Hygiene: Performed;Maximal assistance Where Assessed - Engineer, mining and Hygiene: Sit to stand from 3-in-1 or toilet Tub/Shower Transfer Method: Not assessed Equipment Used: Rolling walker Transfers/Ambulation Related to ADLs: Pt needs mod assist and mod instructional cueing for short distance mobility using the RW. ADL Comments: Pt lethargic this am frequently closes eyese during session.  Pt's significant other present and able to provide information on prior level of function.  Before going to [facility] pt was doing his own bathing and dressing with slight assistance per her report.  However since transferring to the ALF they have been doing everything for him.      OT Diagnosis: Generalized weakness;Acute pain  OT Problem List: Decreased strength;Impaired balance (sitting and/or standing);Decreased activity tolerance;Decreased cognition;Decreased knowledge of use of DME or AE;Decreased knowledge of precautions OT Treatment Interventions: Self-care/ADL training;DME and/or AE instruction;Patient/family education;Balance training;Therapeutic activities   OT Goals Acute Rehab OT Goals OT Goal Formulation: With patient Time For Goal Achievement: 12/26/12 Potential to Achieve Goals: Good ADL Goals Pt Will Perform Grooming: with supervision;Standing at sink;Supported ADL Goal: Grooming - Progress: Goal set today Pt Will Perform Lower Body Bathing: with supervision;Supported;Sit to stand from bed;with adaptive equipment ADL Goal: Lower Body Bathing - Progress: Goal set today Pt Will Perform Upper Body Dressing: with supervision;Unsupported;Sitting, bed ADL Goal: Upper Body Dressing - Progress: Goal set today Pt Will Perform Lower Body Dressing: with supervision;with adaptive equipment;Sit to stand from  bed;Supported ADL Goal: Lower Body Dressing - Progress: Goal set today Pt Will Transfer to Toilet: with supervision;with DME;3-in-1;Ambulation ADL Goal: Toilet Transfer - Progress: Goal set today  Visit Information  Last OT Received On: 12/12/12 Assistance Needed: +1  Subjective Data  Subjective: I think I might have to go to the bathroom. Patient Stated Goal: Did not state but agreeable to participate in therapy.   Prior Functioning     Home Living Lives With: Other (Comment) Available Help at Discharge: Personal care attendant Type of Home: Assisted living Home Access: Level entry Home Layout: One level Bathroom Toilet: Handicapped height Bathroom Accessibility: Yes Home Adaptive Equipment: Walker - four wheeled Prior Function Level of Independence: Needs assistance Needs Assistance: Bathing;Dressing;Meal Prep;Light Housekeeping Bath: Moderate Dressing: Moderate Meal Prep: Total Light Housekeeping: Total Able to Take Stairs?: No Driving: No Vocation: Retired Comments: Per girlfriend pt gets up and down to bathroom and all around ALF wandering with rollator but has to have assist for ADLs. Per girlfriend he has been at [facility] since [place] but was living with her for 64yrs prior to that Communication Communication: HOH Dominant Hand: Right         Vision/Perception Vision - Assessment Vision Assessment: Vision not tested Perception Perception: Within Functional Limits Praxis Praxis: Intact   Cognition  Overall Cognitive Status: History of cognitive impairments - at baseline Area of Impairment: Memory;Problem solving;Safety/judgement Arousal/Alertness: Lethargic Orientation Level: Disoriented to;Place;Time;Situation Behavior During Session: Lethargic Current Attention Level: Focused Cognition - Other Comments: Pt closes eyes frequently.  Has history of dementia    Extremity/Trunk Assessment Right Upper Extremity Assessment RUE ROM/Strength/Tone:  Deficits RUE ROM/Strength/Tone Deficits: Shoulder flexion AROM 0-110 degrees, all other joints AROM WFLs and strength 4/5 including grasp. RUE Sensation: WFL - Light Touch RUE Coordination: Deficits RUE Coordination Deficits: Decreased FM coordination during grooming tasks. Left Upper Extremity Assessment LUE ROM/Strength/Tone: Deficits;Unable to fully assess;Due to impaired cognition LUE ROM/Strength/Tone Deficits: Shoulder flexion AROM 0-70 degrees secondary to history of shoulder surgery and limitations.  All other joints AROM WFLs and strength 4/5. LUE Sensation: WFL - Light Touch LUE Coordination: Deficits LUE Coordination Deficits: Decreased FM coordination during grooming tasks. Right Lower Extremity Assessment RLE ROM/Strength/Tone: Deficits;Unable to fully assess;Due to impaired cognition RLE ROM/Strength/Tone Deficits: grossly 2+/5 unable to get pt to follow commands to further assess RLE Sensation: Deficits Left Lower Extremity Assessment LLE ROM/Strength/Tone: Deficits;Unable to fully assess;Due to impaired cognition LLE ROM/Strength/Tone Deficits: grossly 2+/5 unable to get pt to follow commands to further assess Trunk Assessment Trunk Assessment: Kyphotic     Mobility Bed Mobility Bed Mobility: Rolling Right;Right Sidelying to Sit;Sitting - Scoot to [place] of Bed Rolling Right: 2: Max assist Right Sidelying to Sit: 2: Max assist;HOB flat Sitting - Scoot to [place] of Bed: 4: Min assist Details for Bed Mobility Assistance: max assist to get pt to initiate and perform bed mobility, pt with improved alertness and participation once seated although eyes remained closed, brace donned in sitting and pt unable to assist Transfers Transfers: Sit to Stand Sit to Stand: 3: Mod assist;With upper extremity assist;From chair/3-in-1 Stand to Sit: 3: Mod assist;To chair/3-in-1 Details for Transfer Assistance: Needs max instructional cueing for sequencing sit to stand and hand placement.            Balance Balance Balance Assessed: Yes Static Sitting Balance Static Sitting - Balance Support: Feet supported;Right upper extremity supported Static Sitting - Level of Assistance: 5: Stand by assistance Static Sitting - Comment/# of Minutes: 5 Static Standing Balance Static Standing - Balance Support: Right upper extremity supported;Left upper extremity supported Static Standing - Level of Assistance: 4: Min assist   End of Session OT - End of Session Equipment Utilized During Treatment: Back brace Activity  Tolerance: Patient limited by fatigue Patient left: with family/visitor present;Other (comment) (Pt left on bedside commode with girlfriend present) Nurse Communication: Mobility status;Other (comment) (Communicated with NT that pt was on the 3:1)  GO Functional Assessment Tool Used: clinical judgement Functional Limitation: Self care Self Care Current Status (E4540): At least 40 percent but less than 60 percent impaired, limited or restricted Self Care Goal Status (J8119): At least 20 percent but less than 40 percent impaired, limited or restricted   Genetta Fiero,[place]el[place] OTR/L Pager number [phone] 12/12/2012, 12:51 PM

## 2012-12-12 NOTE — Progress Notes (Signed)
[facility] PROGRESS NOTE  Dale Wong HYQ:[id number] DOB: 1927-02-28 DOA: 12/11/2012 PCP: Willow Ora, MD  Assessment/Plan:  Syncope/fall -  ECHO demonstrates worsening AS, currently moderate.  Will need cardiology follow up -  Carotid duplex pending -  Telemetry demonstrates frequent PVC and ventricular bigeminy -  Continue brace for lumbar compression fracture.  Urinary retention:   -  D/C foley -  PVR 4 hours post foley removal  Dysphagia:  Coughs when eating per family member.  Will have speech therapy do an assessment  Lower extremity venous stasis dermatitis and possible cellulitis -  Family member to bring cream from home -  TED hose -  Continue antibiotics doxy/keflex  Anemia, mild, defer to PCP Thrombocytopenia, mild without evidence of bleeding.  May be due to recent fall and fracture.  stable  GERD, HTN, HLD stable Dementia, improving.  Continuing previous medications and will defer to neurology for further titration of medications.  DIET:  Regular with thin liquids ACCESS:  PIV IVF:  None PROPH:  lovenox  Code Status: Full code Family Communication: spoke with patient and his girlfriend Disposition Plan: likely to home with GF tomorrow pending carotid duplex and speech therapy evaluation   Consultants:  Speech therapy  Procedures:  ECHO  Carotid duplex pending  Antibiotics:  none  HPI/Subjective: Patient states that he feels fine.  Denies lightheadedness, fevers, chills, chest pain, shortness of breath, nausea, vomiting, diarrhea.  He has a foley in place  Objective: Filed Vitals:   12/11/12 1228 12/11/12 2006 12/12/12 0502 12/12/12 1310  BP: 138/70 148/76 129/66 105/54  Pulse: 71 74 79 72  Temp: 98.7 F (37.1 C) 98.5 F (36.9 C) 98.8 F (37.1 C) 98.5 F (36.9 C)  TempSrc: Oral Oral Axillary Oral  Resp: 18 20 18 19   Height: 5\' 10"  (1.778 m)     Weight: 91.082 kg (200 lb 12.8 oz)     SpO2: 100% 98% 98% 99%    Intake/Output  Summary (Last 24 hours) at 12/12/12 1956 Last data filed at 12/12/12 1700  Gross per 24 hour  Intake    480 ml  Output   1920 ml  Net  -1440 ml   Filed Weights   12/11/12 0917 12/11/12 1220 12/11/12 1228  Weight: 95.255 kg (210 lb) 94.348 kg (208 lb) 91.082 kg (200 lb 12.8 oz)    Exam:   General:  CM, sitting in chair, flat affect  HEENT:  MMM  Cardiovascular: RRR, no murmurs, rubs, or gallops  Respiratory: CTAB  Abdomen: NABS, soft, nondistended, nontender, no organomegaly  MSK:  No LEE  Foley with dark urine  Data Reviewed: Basic Metabolic Panel:  Lab 12/12/12 9528 12/11/12 1231 12/11/12 0626  NA 137 -- 135  K 3.3* -- 3.9  CL 102 -- 101  CO2 25 -- 25  GLUCOSE 89 -- 100*  BUN 17 -- 24*  CREATININE 0.89 0.83 0.98  CALCIUM 8.9 -- 8.9  MG -- -- --  PHOS -- -- --   Liver Function Tests: No results found for this basename: AST:5,ALT:5,ALKPHOS:5,BILITOT:5,PROT:5,ALBUMIN:5 in the last 168 hours No results found for this basename: LIPASE:5,AMYLASE:5 in the last 168 hours No results found for this basename: AMMONIA:5 in the last 168 hours CBC:  Lab 12/12/12 0445 12/11/12 1231 12/11/12 0626  WBC 6.9 6.5 7.3  NEUTROABS -- -- --  HGB 12.2* 12.5* 12.8*  HCT 36.9* 36.4* 38.1*  MCV 88.1 87.9 88.4  PLT 120* 124* 125*   Cardiac Enzymes:  Lab  12/12/12 0001 12/11/12 1817 12/11/12 1231  CKTOTAL 75 78 78  CKMB 2.9 3.1 3.5  CKMBINDEX -- -- --  TROPONINI <0.30 <0.30 <0.30   BNP (last 3 results)  Basename 12/11/12 1231  PROBNP 567.4*   CBG: No results found for this basename: GLUCAP:5 in the last 168 hours  Recent Results (from the past 240 hour(s))  URINE CULTURE     Status: Normal   Collection Time   12/11/12  6:50 AM      Component Value Range Status Comment   Specimen Description URINE, RANDOM   Final    Special Requests TEST ADDED [id number]   Final    Culture  Setup Time 12/11/2012 08:41   Final    Colony Count NO GROWTH   Final    Culture NO GROWTH    Final    Report Status 12/12/2012 FINAL   Final   URINE CULTURE     Status: Normal   Collection Time   12/11/12 12:30 PM      Component Value Range Status Comment   Specimen Description URINE, CATHETERIZED   Final    Special Requests NONE   Final    Culture  Setup Time 12/11/2012 13:22   Final    Colony Count NO GROWTH   Final    Culture NO GROWTH   Final    Report Status 12/12/2012 FINAL   Final      Studies: Dg Lumbar Spine Complete  12/11/2012  *RADIOLOGY REPORT*  Clinical Data: Low back pain after fall  LUMBAR SPINE - COMPLETE 4+ VIEW  Comparison: None.  Findings: Bones are diffusely demineralized.  There is convex leftward scoliosis of the lumbar spine. Superior plate compression deformity noted at L1 resulting and 25-50% loss of height anteriorly and less than 25% loss of height posteriorly.  No radiographic findings to suggest significant posterior bony retropulsion of fragments into the spinal canal.  Marked loss of disc height is seen at L4-5.  There is lower lumbar facet degeneration bilaterally.  IMPRESSION: Superior endplate compression fracture at L1 has imaging features which could certainly be acute.  Degenerative changes in the lower lumbar spine.   Original Report Authenticated By: Kennith Center, M.D.    Dg Pelvis 1-2 Views  12/11/2012  *RADIOLOGY REPORT*  Clinical Data: Fall.  Urinary retention.  Low back pain.  PELVIS - 1-2 VIEW  Comparison: 07/14/2010  Findings: Bones are diffusely demineralized.  No evidence for acute fracture. Degenerative changes noted in the right hip.  SI joints and symphysis pubis are unremarkable.  IMPRESSION: No acute bony findings.  Degenerative changes in the right hip.   Original Report Authenticated By: Kennith Center, M.D.    Ct Head Wo Contrast  12/11/2012  *RADIOLOGY REPORT*  Clinical Data: Fall.  Diffuse body pain and stiffness.  CT HEAD WITHOUT CONTRAST  Technique:  Contiguous axial images were obtained from the base of the skull through  the vertex without contrast.  Comparison: None.  Findings: There is no evidence for acute hemorrhage, hydrocephalus, mass lesion, or abnormal extra-axial fluid collection.  No definite CT evidence for acute infarction.  Diffuse loss of parenchymal volume is consistent with atrophy. Prominence of the lateral ventricles with relative sparing of the temporal horns suggest prominent central component to the atrophy. Patchy low attenuation in the deep hemispheric and periventricular white matter is nonspecific, but likely reflects chronic microvascular ischemic demyelination.  Volume averaging with a dural or venous calcification in the high right frontal region noted.  The visualized paranasal sinuses and mastoid air cells are clear.  IMPRESSION: No acute intracranial abnormality.  Advanced atrophy with chronic microvascular ischemic white matter demyelination.   Original Report Authenticated By: Kennith Center, M.D.     Scheduled Meds:   . aspirin EC  81 mg Oral Daily  . calcium-vitamin D  1 tablet Oral Daily  . entacapone  200 mg Oral QID   And  . carbidopa-levodopa  0.5 tablet Oral QID  . cephALEXin  500 mg Oral Q8H  . cholecalciferol  1,000 Units Oral Daily  . citalopram  10 mg Oral Daily  . vitamin B-12  500 mcg Oral Daily  . doxycycline  100 mg Oral Q12H  . enoxaparin (LOVENOX) injection  40 mg Subcutaneous Q24H  . famotidine  40 mg Oral Daily  . furosemide  40 mg Oral Daily  . memantine  5 mg Oral Daily  . Rivastigmine  13.3 mg Transdermal Q24H  . rotigotine  1 patch Transdermal Q24H  . simvastatin  10 mg Oral q1800  . sodium chloride  3 mL Intravenous Q12H  . vitamin C  500 mg Oral Daily   Continuous Infusions:   Principal Problem:  *Syncope Active Problems:  PARKINSON'S DISEASE  GAIT DISTURBANCE  Dermatitis, stasis  Peripheral vascular disease  Adult failure to thrive  Cellulitis lower extremeties  Dementia  L1 vertebral fracture  Fall    Time spent: 28    Tristian Sickinger,  [facility]  [facility] Pager [phone]. If 8PM-8AM, please contact night-coverage at [email or web address], password [facility] 12/12/2012, 7:56 PM  LOS: 1 day

## 2012-12-12 NOTE — Care Management Note (Signed)
    Page 1 of 2   12/13/2012     1:57:40 PM   CARE MANAGEMENT NOTE 12/13/2012  Patient:  MOUSSA, WIEGAND   Account Number:  [id number]  Date Initiated:  12/12/2012  Documentation initiated by:  AMERSON,JULIE  Subjective/Objective Assessment:   PT S/P FALL AT [facility] RESULTING IN L1 COMPRESSION FX.  PT HAS SIGNIF DEMENTIA.  HE HAS SIGNIFICANT OTHER.     Action/Plan:   CSW FOLLOWING CASE; EXTENSIVE CONVERSATION WITH S.O., WHO STATES THAT SHE PLANS TO TAKE PT HOME WITH HER AT DC.  PT WILL NEED HOME HEALTH FOLLOW UP AT DC.   Anticipated DC Date:  12/13/2012   Anticipated DC Plan:  HOME W HOME HEALTH SERVICES      DC Planning Services  CM consult      Choice offered to / List presented to:          [facility] arranged  HH-1 RN  HH-2 PT  HH-3 OT      [facility] agency  [facility]   Status of service:  Completed, signed off Medicare Important Message given?   (If response is "NO", the following Medicare IM given date fields will be blank) Date Medicare IM given:   Date Additional Medicare IM given:    Discharge Disposition:  HOME W HOME HEALTH SERVICES  Per UR Regulation:  Reviewed for med. necessity/level of care/duration of stay  If discussed at Long Length of Stay Meetings, dates discussed:    Comments:  12-13-12 1:55pm Avie Arenas, RNBSN [phone] Talked with patient and care giver at bedside.  Have choosen Wynetta Emery referral given for [facility] PT/OT and nurse.  12/12/12 JULIE AMERSON,RN,BSN [phone] MD:  Leona Carry ORDERS FOR HHRN, PT, OT AND AIDE AT DC. WILL ARRANGE SERVICES AS ORDERED.  LEFT [place] LIST OF HH AGENCIES AT BEDSIDE FOR S.O. TO REVIEW.  WILL FOLLOW UP.

## 2012-12-12 NOTE — Progress Notes (Signed)
SLP Cancellation Note  Patient Details Name: Dale Wong MRN: [id number] DOB: 1927-11-14   Cancelled treatment:        Attempted to see patient for swallow evaluation, however, RN reports patient has been agitated and is finally resting quietly.  RN requests this eval be deferred until 12/20/  The patient is on a Regular diet with thin liquids, and Nursing has not noted any difficulties.  Patient is afebrile, and LS decreased at bases per RN.  SLP will return 12/20.   Maryjo Rochester T 12/12/2012, 4:10 PM

## 2012-12-13 ENCOUNTER — Ambulatory Visit: Payer: MEDICARE | Admitting: Internal Medicine

## 2012-12-13 DIAGNOSIS — R55 Syncope and collapse: Secondary | ICD-10-CM

## 2012-12-13 DIAGNOSIS — I8310 Varicose veins of unspecified lower extremity with inflammation: Secondary | ICD-10-CM

## 2012-12-13 MED ORDER — CEPHALEXIN 500 MG PO CAPS: 500.0000 mg | ORAL_CAPSULE | Freq: Three times a day (TID) | ORAL | Status: DC

## 2012-12-13 MED ORDER — DOXYCYCLINE HYCLATE 100 MG PO TABS: 100.0000 mg | ORAL_TABLET | Freq: Two times a day (BID) | ORAL | Status: DC

## 2012-12-13 NOTE — Progress Notes (Signed)
VASCULAR LAB PRELIMINARY  PRELIMINARY  PRELIMINARY  PRELIMINARY  Carotid duplex     completed.    Preliminary report:  Bilateral:  No evidence of hemodynamically significant internal carotid artery stenosis.   Vertebral artery flow is antegrade.      Latera Mclin, RVT 12/13/2012, 11:21 AM

## 2012-12-13 NOTE — Discharge Summary (Signed)
Physician Discharge Summary  Dale Wong ZOX:[id number] DOB: 08-Jul-1927 DOA: 12/11/2012  PCP: Dale Ora, MD  Admit date: 12/11/2012 Discharge date: 12/13/2012  Recommendations for Outpatient Follow-up:  1. Home health PT/OT 2. Esophagram on 12/29, patient to be called with appointment time by radiology.  Results to be sent to Dale. Leta Wong Wong to be reviewed on 12/30.   3. Antibiotics for an additional 4 days for lower extremity cellulitis with TED hose daily and lotion as before to legs.  Follow up with vascular surgery at already scheduled appointment.   4. Neurology for titration of medications at already scheduled appointment.   Discharge Diagnoses:  Principal Problem:  *Syncope Active Problems:  PARKINSON'S DISEASE  GAIT DISTURBANCE  Dermatitis, stasis  Peripheral vascular disease  Adult failure to thrive  Cellulitis lower extremeties  Dementia  L1 vertebral fracture  Fall   Discharge Condition: stable, improved  Diet recommendation:   Regular;Thin liquid  Liquid Administration via: Cup;No straw  Medication Administration: Whole meds with puree  Supervision: Patient able to self feed  Compensations: Slow rate;Small sips/bites  Postural Changes and/or Swallow Maneuvers: Seated upright 90 degrees;Upright 30-60 min after meal    Wt Readings from Last 3 Encounters:  12/11/12 91.082 kg (200 lb 12.8 oz)  11/05/12 97.523 kg (215 lb)  11/04/12 97.523 kg (215 lb)    History of present illness:   Patient is 78 year old male with history of dementia, gait instability, Parkinson's disease, currently resident of skilled nursing facility had unwitnessed fall on Monday evening 2 days ago. Since then patient had been complaining of back pain and headache. Due to dementia and Parkinson's, patient is not able to give much history. He had poor by mouth intake in the last few days. He ambulates with walker at baseline.  In the ED, patient was found to have L1 compression fracture.  Right Lower leg has blisters with bilateral increased erythema for last 3 weeks per his friend accompanying him in the ED. He is followed by Dale Wong (vascular surgery) for PVD. His PCP, Dale. Drue Wong was contacted by EDP who recommended admission for observation and 2-D echocardiogram.   Hospital Course:   Dale Wong was hospitalized with a syncopal episode and fall.  His ECHO demonstrated worsening aortic stenosis, now moderate.   Ejection fraction was 55-60% with grade 1 diastolic dysfunction.  See full report below.  Telemetry demonstrated frequent PVC and ventricular bigeminy.  Carotid duplex was also negative.  He should follow up with cardiology for further outpatient monitoring of his aortic stenosis.    Lumbar compression fracture:  He was given a back brace for comfort.  In the ER, he had some urinary retention and a foley catheter was placed.  It was discontinued on 12/19 and post void residual was 70mL.    Dysphagia:  He coughs when eating per his girlfriend.  Staff witness the coughing during the first day of hospitalization.  He was seen by speech therapy who identified a possible esophageal problem.  He was cleared for a regular diet with thin liquids.  See details of report above.  He should have an outpatient esophagram, results to be sent to his primary care doctor for follow up as needed.  He sees Dale. Arlyce Wong from GI as an outpatient if follow up is needed.    Lower extremity stasis dermatitis and possible cellulitis.  He was started on doxycycline and keflex on the date of admission for increased erythema of the legs.  His erythema was mostly  resolved by day 2 of admission.  He should continue antibiotics for a total of 7-days, then stop.  He should continue using TED hose and moisturizing cream.    Anemia, mild and thrombocytopenia, mild without evidence of bleeding. May be due to recent fall and fracture.  Defer further evaluation to primary care doctor.    GERD, HTN, HLD were stable   Dementia, improving after initiation of rivastigmine and memantine per girlfriend. Continued previous medications and patient has follow up already scheduled for further titration of medications by Neurology.  Consultants:  Speech therapy Procedures:  ECHO  Carotid duplex pending Antibiotics:  none  Discharge Exam: Filed Vitals:   12/13/12 0335  BP: 157/83  Pulse: 72  Temp: 99.3 F (37.4 C)  Resp: 19   Filed Vitals:   12/12/12 0502 12/12/12 1310 12/12/12 2118 12/13/12 0335  BP: 129/66 105/54 146/72 157/83  Pulse: 79 72 76 72  Temp: 98.8 F (37.1 C) 98.5 F (36.9 C)  99.3 F (37.4 C)  TempSrc: Axillary Oral Oral Oral  Resp: 18 19  19   Height:      Weight:      SpO2: 98% 99% 100% 100%    General: CM, sitting in chair, eating breakfast HEENT: MMM  Cardiovascular: RRR, 2/6 murmur at the LSB.  No rubs, or gallops  Respiratory: CTAB  Abdomen: NABS, soft, nondistended, nontender, no organomegaly  MSK: No LEE, TED hose in place today  Discharge Instructions      Discharge Orders    Future Appointments: Provider: Department: Dept Phone: Center:   12/23/2012 1:30 PM Dale Plump, MD [facility]  [place] [phone] [facility]   12/31/2012 9:15 AM Dale Ochoa, MD [facility] [phone] [facility]     Future Orders Please Complete By Expires   DG Esophagus   12/22/13   Questions: Responses:   Preferred imaging location? [facility]   Reason for exam: food feels like it gets stuck with swallowing Comment - Please send results to Dale. Drue Wong, [phone] phone.     Diet - low sodium heart healthy      Increase activity slowly      Discharge instructions      Comments:   Dale Wong was hospitalized with a fall that led to a compression fracture.  He had an ultrasound of the heart which demonstrated some narrowing of the aortic heart valve, slightly worse than before, and some mild diastolic heart failure.  He also had  some frequent premature ventricular contractions and occasional ventricular bigeminy.  He should follow up with cardiology within a month to discuss monitoring.  He had an ultrasound of the blood vessels of the neck, which showed no obstruction.  For urinary retention, he briefly required a urinary catheter which was quickly removed and he was able to void without problem.  For his difficulty swallowing, he was seen by speech therapy.  He is able to eat regular texture foods and thin liquids, but he should have an esophagram (X-ray of the esophagus) on 12/29 and the results can be sent to the primary care doctor Dale. Drue Wong for discussion at his follow up appointment on 12/30.  He had some cellulitis of the legs which was treated with antibiotics and he should continue antibiotics for 4 more days.  Please continue TED hose every day and use your lotion as before.   Call MD for:  temperature >100.4      Call MD for:  persistant  nausea and vomiting      Call MD for:  severe uncontrolled pain      Call MD for:  difficulty breathing, headache or visual disturbances      Call MD for:  hives      Call MD for:  persistant dizziness or light-headedness      Call MD for:  extreme fatigue      (HEART FAILURE PATIENTS) Call MD:  Anytime you have any of the following symptoms: 1) 3 pound weight gain in 24 hours or 5 pounds in 1 week 2) shortness of breath, with or without a dry hacking cough 3) swelling in the hands, feet or stomach 4) if you have to sleep on extra pillows at night in order to breathe.          Medication List     As of 12/13/2012  1:09 PM    TAKE these medications         aspirin EC 81 MG tablet   Take 81 mg by mouth daily.      CALTRATE 600+D 600-400 MG-UNIT per chew tablet   Generic drug: Calcium Carbonate-Vitamin D   Chew 1 tablet by mouth daily.      carbidopa-levodopa-entacapone 12.5-50-200 MG per tablet   Commonly known as: STALEVO   Take 1 tablet by mouth 4 (four) times daily.       CENTRUM SILVER PO   Take by mouth.      cephALEXin 500 MG capsule   Commonly known as: KEFLEX   Take 1 capsule (500 mg total) by mouth every 8 (eight) hours.      cholecalciferol 1000 UNITS tablet   Commonly known as: VITAMIN D   Take 1,000 Units by mouth daily.      citalopram 20 MG tablet   Commonly known as: CELEXA   Take 10 mg by mouth daily.      doxycycline 100 MG tablet   Commonly known as: VIBRA-TABS   Take 1 tablet (100 mg total) by mouth every 12 (twelve) hours.      entacapone 200 MG tablet   Commonly known as: COMTAN   Take 200 mg by mouth 4 (four) times daily.      EXELON 13.3 MG/24HR Pt24   Generic drug: Rivastigmine   Place 13.3 mg onto the skin daily.      famotidine 40 MG tablet   Commonly known as: PEPCID   Take 1 tablet (40 mg total) by mouth daily.      fish oil-omega-3 fatty acids 1000 MG capsule   Take 2 g by mouth daily.      furosemide 20 MG tablet   Commonly known as: LASIX   One tablet every day, On Monday Wednesday and Fridays take 2 tablets daily      ketoconazole 2 % cream   Commonly known as: NIZORAL   Apply topically daily.      NAMENDA PO   Take 2 tablets by mouth daily.      pravastatin 20 MG tablet   Commonly known as: PRAVACHOL   Take 20 mg by mouth daily.      rotigotine 2 MG/24HR   Commonly known as: NEUPRO   Place 1 patch onto the skin daily.      vitamin B-12 500 MCG tablet   Commonly known as: CYANOCOBALAMIN   Take 500 mcg by mouth daily.      vitamin C 500 MG tablet   Commonly known as: ASCORBIC ACID  Take 500 mg by mouth daily.         Follow-up Information    Follow up with Dale Ora, MD. Schedule an appointment as soon as possible for a visit in 2 weeks. (f/u esophagram)    Contact information:   [address] [facility] [address] [place] [address] [phone]           The results of significant diagnostics from this hospitalization (including imaging, microbiology, ancillary and  laboratory) are listed below for reference.    Significant Diagnostic Studies: Dg Lumbar Spine Complete  12/11/2012  *RADIOLOGY REPORT*  Clinical Data: Low back pain after fall  LUMBAR SPINE - COMPLETE 4+ VIEW  Comparison: None.  Findings: Bones are diffusely demineralized.  There is convex leftward scoliosis of the lumbar spine. Superior plate compression deformity noted at L1 resulting and 25-50% loss of height anteriorly and less than 25% loss of height posteriorly.  No radiographic findings to suggest significant posterior bony retropulsion of fragments into the spinal canal.  Marked loss of disc height is seen at L4-5.  There is lower lumbar facet degeneration bilaterally.  IMPRESSION: Superior endplate compression fracture at L1 has imaging features which could certainly be acute.  Degenerative changes in the lower lumbar spine.   Original Report Authenticated By: Kennith Center, M.D.    Dg Pelvis 1-2 Views  12/11/2012  *RADIOLOGY REPORT*  Clinical Data: Fall.  Urinary retention.  Low back pain.  PELVIS - 1-2 VIEW  Comparison: 07/14/2010  Findings: Bones are diffusely demineralized.  No evidence for acute fracture. Degenerative changes noted in the right hip.  SI joints and symphysis pubis are unremarkable.  IMPRESSION: No acute bony findings.  Degenerative changes in the right hip.   Original Report Authenticated By: Kennith Center, M.D.    Ct Head Wo Contrast  12/11/2012  *RADIOLOGY REPORT*  Clinical Data: Fall.  Diffuse body pain and stiffness.  CT HEAD WITHOUT CONTRAST  Technique:  Contiguous axial images were obtained from the base of the skull through the vertex without contrast.  Comparison: None.  Findings: There is no evidence for acute hemorrhage, hydrocephalus, mass lesion, or abnormal extra-axial fluid collection.  No definite CT evidence for acute infarction.  Diffuse loss of parenchymal volume is consistent with atrophy. Prominence of the lateral ventricles with relative sparing of the  temporal horns suggest prominent central component to the atrophy. Patchy low attenuation in the deep hemispheric and periventricular white matter is nonspecific, but likely reflects chronic microvascular ischemic demyelination.  Volume averaging with a dural or venous calcification in the high right frontal region noted.  The visualized paranasal sinuses and mastoid air cells are clear.  IMPRESSION: No acute intracranial abnormality.  Advanced atrophy with chronic microvascular ischemic white matter demyelination.   Original Report Authenticated By: Kennith Center, M.D.     [facility]* *[facility]* [address] [place], [place] [address] [phone]  ------------------------------------------------------------ Transthoracic Echocardiography  Patient: Binnie, Vonderhaar MR #: [id number] Study Date: 12/11/2012 Gender: M Age: 78 Height: 177.8cm Weight: 90.9kg BSA: 2.87m^2 Pt. Status: Room: 2035  PERFORMING [place], [facility] ADMITTING Rai, Ripudeep ATTENDING Rai, Ripudeep ORDERING Rai, Ripudeep SONOGRAPHER Jeryl Columbia cc:  ------------------------------------------------------------ LV EF: 55% - 60%  ------------------------------------------------------------ Indications: Syncope 780.2.  ------------------------------------------------------------ History: PMH: Aortic Stenosis, Edema, Dementia, Parkinson's Disease Syncope. Risk factors: Dyslipidemia.  ------------------------------------------------------------ Study Conclusions  - Left ventricle: The cavity size was normal. Wall thickness was increased in a pattern of mild LVH. Systolic function  was normal. The estimated ejection fraction was in the range of 55% to 60%. Wall motion was normal; there were no regional wall motion abnormalities. Doppler parameters are consistent with abnormal left ventricular relaxation (grade 1 diastolic dysfunction). - Aortic valve: Valve mobility was  restricted. There was moderate stenosis. Trivial regurgitation. Valve area: 1.02cm^2(VTI). Valve area: 1.14cm^2 (Vmax). - Mitral valve: Calcified annulus. - Left atrium: The atrium was mildly dilated. Transthoracic echocardiography. M-mode, complete 2D, spectral Doppler, and color Doppler. Height: Height: 177.8cm. Height: 70in. Weight: Weight: 90.9kg. Weight: 200lb. Body mass index: BMI: 28.8kg/m^2. Body surface area: BSA: 2.84m^2. Blood pressure: 138/70. Patient status: Inpatient. Location: Bedside.  ------------------------------------------------------------  ------------------------------------------------------------ Left ventricle: The cavity size was normal. Wall thickness was increased in a pattern of mild LVH. Systolic function was normal. The estimated ejection fraction was in the range of 55% to 60%. Wall motion was normal; there were no regional wall motion abnormalities. Doppler parameters are consistent with abnormal left ventricular relaxation (grade 1 diastolic dysfunction).  ------------------------------------------------------------ Aortic valve: Trileaflet; moderately calcified leaflets. Valve mobility was restricted. Doppler: There was moderate stenosis. Trivial regurgitation. VTI ratio of LVOT to aortic valve: 0.29. Valve area: 1.02cm^2(VTI). Indexed valve area: 0.48cm^2/m^2 (VTI). Peak velocity ratio of LVOT to aortic valve: 0.33. Valve area: 1.14cm^2 (Vmax). Indexed valve area: 0.53cm^2/m^2 (Vmax). Mean gradient: 31mm Hg (S). Peak gradient: 50mm Hg (S).  ------------------------------------------------------------ Aorta: Aortic root: The aortic root was normal in size.  ------------------------------------------------------------ Mitral valve: Calcified annulus. Mobility was not restricted. Doppler: Transvalvular velocity was within the normal range. There was no evidence for stenosis. No regurgitation. Peak gradient: 4mm Hg  (D).  ------------------------------------------------------------ Left atrium: The atrium was mildly dilated.  ------------------------------------------------------------ Right ventricle: The cavity size was normal. Systolic function was normal.  ------------------------------------------------------------ Pulmonic valve: Doppler: Transvalvular velocity was within the normal range. There was no evidence for stenosis. Trivial regurgitation.  ------------------------------------------------------------ Tricuspid valve: Structurally normal valve. Doppler: Transvalvular velocity was within the normal range. Mild regurgitation.  ------------------------------------------------------------ Right atrium: The atrium was normal in size.  ------------------------------------------------------------ Pericardium: There was no pericardial effusion.  ------------------------------------------------------------ Systemic veins: Inferior vena cava: The vessel was normal in size.  ------------------------------------------------------------  2D measurements Normal Doppler measurements Normal Left ventricle LVOT LVID ED, 36.5 mm 43-52 Peak vel, 117 cm/s ------ chord, S PLAX VTI, S 24.6 cm ------ LVID ES, 26.3 mm 23-38 Peak 5 mm Hg ------ chord, gradient, PLAX S FS, chord, 28 % >29 Aortic valve PLAX Peak vel, 354 cm/s ------ LVPW, ED 12.8 mm ------ S IVS/LVPW 0.9 <1.3 Mean vel, 263 cm/s ------ ratio, ED S Ventricular septum VTI, S 83.7 cm ------ IVS, ED 11.5 mm ------ Mean 31 mm Hg ------ LVOT gradient, Diam, S 21 mm ------ S Area 3.46 cm^2 ------ Peak 50 mm Hg ------ Aorta gradient, Root diam, 33 mm ------ S ED VTI ratio 0.29 ------ Left atrium LVOT/AV AP dim 46 mm ------ Area, VTI 1.02 cm^2 ------ AP dim 2.15 cm/m^2 <2.2 Area index 0.48 cm^2/m ------ index (VTI) ^2 Peak vel 0.33 ------ ratio, LVOT/AV Area, Vmax 1.14 cm^2 ------ Area index 0.53 cm^2/m ------ (Vmax)  ^2 Mitral valve Peak E vel 99.3 cm/s ------ Peak A vel 139 cm/s ------ Decelerati 447 ms 150-23 on time 0 Peak 4 mm Hg ------ gradient, D Peak E/A 0.7 ------ ratio Systemic veins Estimated 10 mm Hg ------ CVP  -----------------------------------------------------------    Microbiology: Recent Results (from the past 240 hour(s))  URINE CULTURE     Status: Normal   Collection  Time   12/11/12  6:50 AM      Component Value Range Status Comment   Specimen Description URINE, RANDOM   Final    Special Requests TEST ADDED [id number]   Final    Culture  Setup Time 12/11/2012 08:41   Final    Colony Count NO GROWTH   Final    Culture NO GROWTH   Final    Report Status 12/12/2012 FINAL   Final   URINE CULTURE     Status: Normal   Collection Time   12/11/12 12:30 PM      Component Value Range Status Comment   Specimen Description URINE, CATHETERIZED   Final    Special Requests NONE   Final    Culture  Setup Time 12/11/2012 13:22   Final    Colony Count NO GROWTH   Final    Culture NO GROWTH   Final    Report Status 12/12/2012 FINAL   Final      Labs: Basic Metabolic Panel:  Lab 12/12/12 0454 12/11/12 1231 12/11/12 0626  NA 137 -- 135  K 3.3* -- 3.9  CL 102 -- 101  CO2 25 -- 25  GLUCOSE 89 -- 100*  BUN 17 -- 24*  CREATININE 0.89 0.83 0.98  CALCIUM 8.9 -- 8.9  MG -- -- --  PHOS -- -- --   Liver Function Tests: No results found for this basename: AST:5,ALT:5,ALKPHOS:5,BILITOT:5,PROT:5,ALBUMIN:5 in the last 168 hours No results found for this basename: LIPASE:5,AMYLASE:5 in the last 168 hours No results found for this basename: AMMONIA:5 in the last 168 hours CBC:  Lab 12/12/12 0445 12/11/12 1231 12/11/12 0626  WBC 6.9 6.5 7.3  NEUTROABS -- -- --  HGB 12.2* 12.5* 12.8*  HCT 36.9* 36.4* 38.1*  MCV 88.1 87.9 88.4  PLT 120* 124* 125*   Cardiac Enzymes:  Lab 12/12/12 0001 12/11/12 1817 12/11/12 1231  CKTOTAL 75 78 78  CKMB 2.9 3.1 3.5  CKMBINDEX -- -- --   TROPONINI <0.30 <0.30 <0.30   BNP: BNP (last 3 results)  Basename 12/11/12 1231  PROBNP 567.4*   CBG: No results found for this basename: GLUCAP:5 in the last 168 hours  Time coordinating discharge: 45 minutes  Signed:  Zalyn Amend  [facility] 12/13/2012, 1:09 PM

## 2012-12-13 NOTE — Progress Notes (Signed)
Progress Note in addition to note from 2023/12/21  12/20/12 1018  PT G-Codes **NOT FOR INPATIENT CLASS**  Functional Assessment Tool Used clinical judgement  Functional Limitation Mobility: Walking and moving around  Mobility: Walking and Moving Around Current Status [id number]) CK  Mobility: Walking and Moving Around Goal Status (H8469) CJ  PT General Charges  $$ ACUTE PT VISIT 1 Procedure  PT Evaluation  $Initial PT Evaluation Tier I 1 Procedure  PT Treatments  $Therapeutic Activity 23-37 mins   Delaney Meigs, PT [phone]

## 2012-12-13 NOTE — Telephone Encounter (Signed)
Admitted with syncope, apparently still  in the hospital, please call when he is released home , I'll arrange whatever is needed

## 2012-12-13 NOTE — Evaluation (Addendum)
Clinical/Bedside Swallow Evaluation Patient Details  Name: Dale Wong MRN: [id number] Date of Birth: February 15, 1927  Today's Date: 12/13/2012 Time: 0915-0930 SLP Time Calculation (min): 15 min  Past Medical History:  Past Medical History  Diagnosis Date  . Hyperlipidemia   . Fatigue   . Parkinson disease     Sees neurology in [place]  . Depression   . Dementia     sees neurology in [place]  . Alcohol abuse   . Colon cancer     Last colonoscopy November 2011,   Past Surgical History:  Past Surgical History  Procedure Date  . Spinal fusion     back surgery for spinal stenosis  . Shoulder arthroscopy 4/11    s/p  . Colectomy    HPI:  78 yr old   Assessment / Plan / Recommendation Clinical Impression  Pt. intermittently belching and exhibiting delayed coughs (1-2 minutes) after thin and nectar thick liquids that appeared suspicious for possible esophageal impairments.  SLP unable to locate history of GERD in chart, however wife reports pt. saw Dr. Arlyce Dice 11/13 and thought he may have had an endoscopy and recommended reflux meds.  SLP recommeds a baruim esophagram to assess esophgeal function.  Pt. likely discharging today, therefore barium esophagram can be performed as outpatient.  WIll discuss with MD.  Recommend continue regular texture diet, thin liquids, meds whole in applesauce and esophageal precautions.  No further ST recommended at this time.    Aspiration Risk  Mild    Diet Recommendation Regular;Thin liquid   Liquid Administration via: Cup;No straw Medication Administration: Whole meds with puree Supervision: Patient able to self feed Compensations: Slow rate;Small sips/bites Postural Changes and/or Swallow Maneuvers: Seated upright 90 degrees;Upright 30-60 min after meal    Other  Recommendations Recommended Consults:  (barium esophagram) Oral Care Recommendations: Oral care BID   Follow Up Recommendations   (outpatient esophagram)                 Swallow  Study Prior Functional Status       General HPI: 78 yr old Type of Study: Bedside swallow evaluation Diet Prior to this Study: Regular;Thin liquids Temperature Spikes Noted: Yes (99.3) Respiratory Status: Room air History of Recent Intubation: No Behavior/Cognition: Alert;Cooperative;Pleasant mood Oral Cavity - Dentition: Adequate natural dentition Self-Feeding Abilities: Able to feed self Patient Positioning: Upright in bed Baseline Vocal Quality: Clear Volitional Cough: Strong Volitional Swallow: Able to elicit    Oral/Motor/Sensory Function Overall Oral Motor/Sensory Function: Appears within functional limits for tasks assessed   Ice Chips     Thin Liquid Thin Liquid: Impaired Presentation: Cup;Straw Pharyngeal  Phase Impairments: Cough - Delayed    Nectar Thick     Honey Thick     Puree Puree: Within functional limits   Solid      Solid: Within functional limits       Royce Macadamia M.Ed [employer] [phone]  12/13/2012

## 2012-12-16 ENCOUNTER — Telehealth: Payer: Self-pay | Admitting: Internal Medicine

## 2012-12-16 NOTE — Telephone Encounter (Signed)
Discussed with pt's wife. She states she would like to keep the appt on 12.30.13. She states that the pt is currently taking a pain med for his back that was called in by another doctor yesterday.

## 2012-12-16 NOTE — Telephone Encounter (Signed)
Please advise.  Pt has future appt scheduled 12.30.13

## 2012-12-16 NOTE — Telephone Encounter (Signed)
[facility] Triage Call Report Triage Record Num: [id number] Operator: Baldomero Lamy Patient Name: Dale Wong Call Date & Time: 12/15/2012 11:53:53AM Patient Phone: [phone] PCP: Nolon Rod. Paz Patient Gender: Male PCP Fax : Patient DOB: 05-10-1927 Practice Name: [facility] Reason for Call: Caller: Jean/Other; PCP: Willow Ora; CB#: [phone]; Call regarding Back Pain; Wife calling regarding back pain. Fractured back via xray; hospitalized 12/20-22. Discharged with no meds or instructions. Pt is fine while lying in bed but is in sever pain with movement or ambulation. Emergent sx of Back Symptoms: Severe back pain AND history of IV drug use, alcoholism, or previous trauma. Spoke with Dr. Rogelia Rohrer: ordered 50 mg Tramadol po TID prn and f/u with Dr. [name] on 12/16/12. Care advice given with call back parameters. Protocol(s) Used: Back Symptoms Recommended Outcome per Protocol: See ED Immediately Override Outcome if Used in Protocol: Call Provider within 24 Hours RN Reason for Override Outcome: Physician Orders. Reason for Outcome: Severe back pain AND history of IV drug use, alcoholism, or previous spinal trauma Care Advice: Call EMS 911 immediately if you suddenly can't walk, have muscle weakness or numbness in your legs, or experience a loss of bowel or bladder function. ~ ~ IMMEDIATE ACTION

## 2012-12-16 NOTE — Telephone Encounter (Signed)
Discharge summary reviewed, needs to his followup 12/23/2012. If he needs a followup sooner than that, please try to arrange. As far as the pain, he can try Tylenol  500 mg OTC 2 tabs a day every 8 hours as needed ----->  if the pain is severe instead of  Tylenol he could  use Vicodin 5/500  1 po TID, #20, no refills. Does need to watch for constipation and increased somnolence.

## 2012-12-16 NOTE — Telephone Encounter (Signed)
Pt scheduled appt 12.30.13.

## 2012-12-23 ENCOUNTER — Telehealth: Payer: Self-pay | Admitting: *Deleted

## 2012-12-23 ENCOUNTER — Ambulatory Visit: Payer: MEDICARE | Admitting: Internal Medicine

## 2012-12-23 NOTE — Telephone Encounter (Signed)
Adrienne with [facility] left a msg stating she saw the pt over the weekend & would like to do PT & OT 3x a week for 4 weeks. She would also like to have a Home health aid 2x a week for 3 weeks and she would like to have the pt enter their Cardio pulmonary program. Please advise.

## 2012-12-23 NOTE — Telephone Encounter (Signed)
Agree, please give orders

## 2012-12-24 NOTE — Telephone Encounter (Signed)
Left detailed msg on adrienne's vmail.

## 2012-12-31 ENCOUNTER — Ambulatory Visit: Payer: MEDICARE | Admitting: Vascular Surgery

## 2012-12-31 ENCOUNTER — Ambulatory Visit: Payer: MEDICARE | Admitting: Internal Medicine

## 2013-01-03 ENCOUNTER — Telehealth: Payer: Self-pay | Admitting: *Deleted

## 2013-01-03 NOTE — Telephone Encounter (Signed)
Left detailed msg on Dale Wong's vmail.

## 2013-01-03 NOTE — Telephone Encounter (Signed)
Please arrange

## 2013-01-03 NOTE — Telephone Encounter (Signed)
Junious Dresser, an occupational therapist with [facility] left a msg requesting a verbal order for occupational therapy 2 times a week for 5 weeks and she would also like an order for the pt to be seen by a Child psychotherapist. Please advise.

## 2013-01-09 ENCOUNTER — Telehealth: Payer: Self-pay | Admitting: *Deleted

## 2013-01-09 NOTE — Telephone Encounter (Signed)
Junious Dresser with [facility] called stating that she was there with the pt & she has been checking his BP & it was 70/43 & 67/52. She stated that the pt's wife told her the pt did not sleep well last night and has been very restless. I advise Junious Dresser that the pt needs to go to the ER. Junious Dresser understood.

## 2013-01-10 ENCOUNTER — Telehealth: Payer: Self-pay | Admitting: *Deleted

## 2013-01-10 NOTE — Telephone Encounter (Signed)
Called patients home, spoke with wife she states she rechecked his blood pressure at 10:15pm and it is 126/78 within normal range. Wife states patients appetite is good and he slept well..she checked his B/P several times during the night it was was fine. Advised wife that if patients B/P continued to be on the low side, then he may need further evaluation in the ER. She verbalized understanding of this.

## 2013-01-10 NOTE — Telephone Encounter (Signed)
Agree with the ER eval , patient with hypotension. Please check on him Monday.

## 2013-01-10 NOTE — Telephone Encounter (Signed)
Gar Gibbon with [facility] called stating that the pt's caregiver is willing to consider assisted living placement. Gar Gibbon will be here next week to drop off paperwork that needs to be completed. Gar Gibbon would also needs a verbal order for additional social work visits. Please advise.

## 2013-01-12 DIAGNOSIS — I5032 Chronic diastolic (congestive) heart failure: Secondary | ICD-10-CM

## 2013-01-12 DIAGNOSIS — R269 Unspecified abnormalities of gait and mobility: Secondary | ICD-10-CM

## 2013-01-12 DIAGNOSIS — G20 Parkinson's disease: Secondary | ICD-10-CM

## 2013-01-12 DIAGNOSIS — F0390 Unspecified dementia without behavioral disturbance: Secondary | ICD-10-CM

## 2013-01-12 DIAGNOSIS — R627 Adult failure to thrive: Secondary | ICD-10-CM

## 2013-01-13 DIAGNOSIS — Z0279 Encounter for issue of other medical certificate: Secondary | ICD-10-CM

## 2013-01-13 NOTE — Telephone Encounter (Signed)
lmovm for pt to return call.  

## 2013-01-13 NOTE — Telephone Encounter (Signed)
Gave verbal order to [employer] w/ [facility].

## 2013-01-13 NOTE — Telephone Encounter (Signed)
Ok verbal order for Child psychotherapist visits

## 2013-01-16 ENCOUNTER — Telehealth: Payer: Self-pay | Admitting: *Deleted

## 2013-01-16 NOTE — Telephone Encounter (Signed)
Updated med list

## 2013-01-21 ENCOUNTER — Ambulatory Visit: Payer: MEDICARE | Admitting: Internal Medicine

## 2013-01-27 ENCOUNTER — Telehealth: Payer: Self-pay | Admitting: *Deleted

## 2013-01-27 NOTE — Telephone Encounter (Signed)
Left message to call office

## 2013-01-27 NOTE — Telephone Encounter (Signed)
VM left requesting order to go out 1 more time in February for social work visit to discuss with Mr Galdamez placement and facility. Pt is also schedule to see Dr [name] on tomorrow. Please advise

## 2013-01-27 NOTE — Telephone Encounter (Signed)
Yes, please ok the additional social worker visit

## 2013-01-28 ENCOUNTER — Ambulatory Visit: Payer: MEDICARE | Admitting: Internal Medicine

## 2013-01-28 ENCOUNTER — Ambulatory Visit: Payer: MEDICARE | Admitting: Vascular Surgery

## 2013-01-28 NOTE — Telephone Encounter (Signed)
Left detail message with verbal ok

## 2013-03-05 ENCOUNTER — Emergency Department (HOSPITAL_COMMUNITY)
Admission: EM | Admit: 2013-03-05 | Discharge: 2013-03-05 | Disposition: A | Discharge status: Skilled Nursing Facility | Payer: MEDICARE | Attending: Emergency Medicine | Admitting: Emergency Medicine

## 2013-03-05 ENCOUNTER — Emergency Department (HOSPITAL_COMMUNITY): Payer: MEDICARE

## 2013-03-05 ENCOUNTER — Encounter (HOSPITAL_COMMUNITY): Payer: Self-pay | Admitting: *Deleted

## 2013-03-05 DIAGNOSIS — Y92009 Unspecified place in unspecified non-institutional (private) residence as the place of occurrence of the external cause: Secondary | ICD-10-CM | POA: Insufficient documentation

## 2013-03-05 DIAGNOSIS — W19XXXA Unspecified fall, initial encounter: Secondary | ICD-10-CM

## 2013-03-05 DIAGNOSIS — F1010 Alcohol abuse, uncomplicated: Secondary | ICD-10-CM | POA: Insufficient documentation

## 2013-03-05 DIAGNOSIS — R0989 Other specified symptoms and signs involving the circulatory and respiratory systems: Secondary | ICD-10-CM | POA: Insufficient documentation

## 2013-03-05 DIAGNOSIS — R296 Repeated falls: Secondary | ICD-10-CM | POA: Insufficient documentation

## 2013-03-05 DIAGNOSIS — R609 Edema, unspecified: Secondary | ICD-10-CM | POA: Insufficient documentation

## 2013-03-05 DIAGNOSIS — F3289 Other specified depressive episodes: Secondary | ICD-10-CM | POA: Insufficient documentation

## 2013-03-05 DIAGNOSIS — G250 Essential tremor: Secondary | ICD-10-CM | POA: Insufficient documentation

## 2013-03-05 DIAGNOSIS — E785 Hyperlipidemia, unspecified: Secondary | ICD-10-CM | POA: Insufficient documentation

## 2013-03-05 DIAGNOSIS — E876 Hypokalemia: Secondary | ICD-10-CM | POA: Insufficient documentation

## 2013-03-05 DIAGNOSIS — R011 Cardiac murmur, unspecified: Secondary | ICD-10-CM | POA: Insufficient documentation

## 2013-03-05 DIAGNOSIS — G20A1 Parkinson's disease without dyskinesia, without mention of fluctuations: Secondary | ICD-10-CM | POA: Insufficient documentation

## 2013-03-05 DIAGNOSIS — F0390 Unspecified dementia without behavioral disturbance: Secondary | ICD-10-CM | POA: Insufficient documentation

## 2013-03-05 DIAGNOSIS — Y9389 Activity, other specified: Secondary | ICD-10-CM | POA: Insufficient documentation

## 2013-03-05 DIAGNOSIS — C189 Malignant neoplasm of colon, unspecified: Secondary | ICD-10-CM | POA: Insufficient documentation

## 2013-03-05 LAB — COMPREHENSIVE METABOLIC PANEL
ALT: 31 U/L (ref 0–53)
CO2: 22 mEq/L (ref 19–32)
Calcium: 9.3 mg/dL (ref 8.4–10.5)
Chloride: 104 mEq/L (ref 96–112)
Creatinine, Ser: 0.75 mg/dL (ref 0.50–1.35)
GFR calc Af Amer: >90 mL/min (ref >90)
GFR calc non Af Amer: 82 mL/min — ABNORMAL LOW (ref >90)
Glucose, Bld: 102 mg/dL — ABNORMAL HIGH (ref 70–99)
Sodium: 141 mEq/L (ref 135–145)
Total Bilirubin: 0.9 mg/dL (ref 0.3–1.2)

## 2013-03-05 LAB — CBC WITH DIFFERENTIAL/PLATELET
Eosinophils Relative: 2 % (ref 0–5)
HCT: 39.4 % (ref 39.0–52.0)
Hemoglobin: 13.6 g/dL (ref 13.0–17.0)
Lymphocytes Relative: 20 % (ref 12–46)
Lymphs Abs: 1.2 10*3/uL (ref 0.7–4.0)
MCV: 86.2 fL (ref 78.0–100.0)
Monocytes Absolute: 0.6 10*3/uL (ref 0.1–1.0)
RBC: 4.57 MIL/uL (ref 4.22–5.81)
WBC: 6.2 10*3/uL (ref 4.0–10.5)

## 2013-03-05 LAB — URINALYSIS, ROUTINE W REFLEX MICROSCOPIC
Glucose, UA: NEGATIVE mg/dL
Hgb urine dipstick: NEGATIVE
Specific Gravity, Urine: 1.035 — ABNORMAL HIGH (ref 1.005–1.030)
Urobilinogen, UA: 1 mg/dL (ref 0.0–1.0)

## 2013-03-05 LAB — APTT: aPTT: 36 seconds (ref 24–37)

## 2013-03-05 LAB — POCT I-STAT TROPONIN I: Troponin i, poc: 0.04 ng/mL (ref 0.00–0.08)

## 2013-03-05 LAB — URINE MICROSCOPIC-ADD ON

## 2013-03-05 MED ORDER — SODIUM CHLORIDE 0.9 % IV BOLUS (SEPSIS)
500.0000 mL | Freq: Once | INTRAVENOUS | Status: AC
  Administered 2013-03-05: 500 mL via INTRAVENOUS

## 2013-03-05 MED ORDER — TUBERCULIN PPD 5 UNIT/0.1ML ID SOLN
5.0000 [IU] | Freq: Once | INTRADERMAL | Status: DC
  Administered 2013-03-05: 5 [IU] via INTRADERMAL
  Filled 2013-03-05: qty 0.1

## 2013-03-05 MED ORDER — POTASSIUM CHLORIDE CRYS ER 20 MEQ PO TBCR
40.0000 meq | EXTENDED_RELEASE_TABLET | Freq: Once | ORAL | Status: AC
  Administered 2013-03-05: 40 meq via ORAL
  Filled 2013-03-05: qty 2

## 2013-03-05 MED ORDER — SODIUM CHLORIDE 0.9 % IV SOLN
Freq: Once | INTRAVENOUS | Status: AC
  Administered 2013-03-05: 18:00:00 via INTRAVENOUS
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
## 2013-03-05 NOTE — ED Notes (Signed)
Pt remains in xr

## 2013-03-05 NOTE — Clinical Social Work Note (Signed)
CSW received consult re: pt care at home.  CSW will assess.  Vickii Penna, LCSWA [phone]  Clinical Social Work
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
## 2013-03-05 NOTE — ED Notes (Signed)
SW at bedside.

## 2013-03-05 NOTE — ED Notes (Signed)
Pt waiting for [facility] to be transported to [facility].

## 2013-03-05 NOTE — ED Notes (Signed)
Pt arrives by [facility] from home with c/o fall/hypoglycemia. Pt fell 6 days ago. PTAR reports that pt's CBG 64 pta.

## 2013-03-05 NOTE — ED Notes (Signed)
Patient transported to X-ray 

## 2013-03-06 ENCOUNTER — Emergency Department (HOSPITAL_COMMUNITY)
Admission: EM | Admit: 2013-03-06 | Discharge: 2013-03-06 | Disposition: A | Discharge status: Skilled Nursing Facility | Payer: MEDICARE | Attending: Emergency Medicine | Admitting: Emergency Medicine

## 2013-03-06 ENCOUNTER — Encounter (HOSPITAL_COMMUNITY): Payer: Self-pay | Admitting: Emergency Medicine

## 2013-03-06 ENCOUNTER — Emergency Department (HOSPITAL_COMMUNITY): Payer: MEDICARE

## 2013-03-06 DIAGNOSIS — Z79899 Other long term (current) drug therapy: Secondary | ICD-10-CM | POA: Insufficient documentation

## 2013-03-06 DIAGNOSIS — E785 Hyperlipidemia, unspecified: Secondary | ICD-10-CM | POA: Insufficient documentation

## 2013-03-06 DIAGNOSIS — S0003XA Contusion of scalp, initial encounter: Secondary | ICD-10-CM | POA: Insufficient documentation

## 2013-03-06 DIAGNOSIS — S91109A Unspecified open wound of unspecified toe(s) without damage to nail, initial encounter: Secondary | ICD-10-CM | POA: Insufficient documentation

## 2013-03-06 DIAGNOSIS — Z7902 Long term (current) use of antithrombotics/antiplatelets: Secondary | ICD-10-CM | POA: Insufficient documentation

## 2013-03-06 DIAGNOSIS — S01309A Unspecified open wound of unspecified ear, initial encounter: Secondary | ICD-10-CM | POA: Insufficient documentation

## 2013-03-06 DIAGNOSIS — W19XXXA Unspecified fall, initial encounter: Secondary | ICD-10-CM

## 2013-03-06 DIAGNOSIS — Z7982 Long term (current) use of aspirin: Secondary | ICD-10-CM | POA: Insufficient documentation

## 2013-03-06 DIAGNOSIS — F3289 Other specified depressive episodes: Secondary | ICD-10-CM | POA: Insufficient documentation

## 2013-03-06 DIAGNOSIS — Y939 Activity, unspecified: Secondary | ICD-10-CM | POA: Insufficient documentation

## 2013-03-06 DIAGNOSIS — S60229A Contusion of unspecified hand, initial encounter: Secondary | ICD-10-CM | POA: Insufficient documentation

## 2013-03-06 DIAGNOSIS — Y9210 Unspecified residential institution as the place of occurrence of the external cause: Secondary | ICD-10-CM | POA: Insufficient documentation

## 2013-03-06 DIAGNOSIS — R296 Repeated falls: Secondary | ICD-10-CM | POA: Insufficient documentation

## 2013-03-06 DIAGNOSIS — G20A1 Parkinson's disease without dyskinesia, without mention of fluctuations: Secondary | ICD-10-CM | POA: Insufficient documentation

## 2013-03-06 DIAGNOSIS — S01501A Unspecified open wound of lip, initial encounter: Secondary | ICD-10-CM | POA: Insufficient documentation

## 2013-03-06 DIAGNOSIS — F0390 Unspecified dementia without behavioral disturbance: Secondary | ICD-10-CM | POA: Insufficient documentation

## 2013-03-06 DIAGNOSIS — Z85038 Personal history of other malignant neoplasm of large intestine: Secondary | ICD-10-CM | POA: Insufficient documentation

## 2013-03-06 LAB — GLUCOSE, CAPILLARY: Glucose-Capillary: 86 mg/dL (ref 70–99)

## 2013-03-06 MED ORDER — TETANUS-DIPHTH-ACELL PERTUSSIS 5-2.5-18.5 LF-MCG/0.5 IM SUSP: 0.5000 mL | Freq: Once | INTRAMUSCULAR | Status: DC

## 2013-03-06 MED ORDER — ENSURE COMPLETE PO LIQD
237.0000 mL | Freq: Two times a day (BID) | ORAL | Status: DC
  Filled 2013-03-06 (×2): qty 237

## 2013-03-06 NOTE — ED Provider Notes (Signed)
History     CSN: [id number]  Arrival date & time 03/06/13  0022   First MD Initiated Contact with Patient 03/06/13 0040      Chief Complaint  Patient presents with  . Fall    (Consider location/radiation/quality/duration/timing/severity/associated sxs/prior treatment) HPI 79 year old male presents to emergency room via EMS from his new nursing facility with reported fall.  Patient was in the emergency room earlier today after having a fall at home.  Several days ago.  Patient was placed in a nursing facility, as his caretaker is no longer able to care for him.  Patient fell at his new nursing facility, and sustained a laceration to his left ear, left upper lip, and contusion to left eye.  Patient has had his right fifth toenail avulsed.  His left hand is swollen and bruised.  Patient has dementia, and is confused at baseline.  He denies any other injuries or complaints at this time. Past Medical History  Diagnosis Date  . Hyperlipidemia   . Fatigue   . Parkinson disease     Sees neurology in [place]  . Depression   . Dementia     sees neurology in [place]  . Alcohol abuse   . Colon cancer     Last colonoscopy November 2011,    Past Surgical History  Procedure Laterality Date  . Spinal fusion      back surgery for spinal stenosis  . Shoulder arthroscopy  4/11    s/p  . Colectomy      Family History  Problem Relation Age of Onset  . Liver cancer Brother   . Diabetes Neg Hx   . Coronary artery disease Neg Hx     History  Substance Use Topics  . Smoking status: Never Smoker   . Smokeless tobacco: Never Used  . Alcohol Use: No      Review of Systems  Unable to perform ROS: Dementia    Allergies  Review of patient's allergies indicates no known allergies.  Home Medications   Current Outpatient Rx  Name  Route  Sig  Dispense  Refill  . aspirin EC 81 MG tablet   Oral   Take 81 mg by mouth at bedtime.          . Calcium Carbonate-Vitamin D (CALTRATE 600+D)  600-400 MG-UNIT per chew tablet   Oral   Chew 1 tablet by mouth daily.           . carbidopa-levodopa (PARCOPA) 25-250 MG per disintegrating tablet   Oral   Take 1 tablet by mouth 4 (four) times daily.         . cholecalciferol (VITAMIN D) 1000 UNITS tablet   Oral   Take 1,000 Units by mouth daily.         . citalopram (CELEXA) 20 MG tablet   Oral   Take 10 mg by mouth daily.           . entacapone (COMTAN) 200 MG tablet   Oral   Take 200 mg by mouth 4 (four) times daily.          . fish oil-omega-3 fatty acids 1000 MG capsule   Oral   Take 2 g by mouth daily.           . memantine (NAMENDA) 10 MG tablet   Oral   Take 10 mg by mouth 2 (two) times daily.         . Multiple Vitamin (MULTIVITAMIN WITH MINERALS) TABS  Oral   Take 1 tablet by mouth daily.         . pravastatin (PRAVACHOL) 20 MG tablet   Oral   Take 20 mg by mouth at bedtime.          . Rivastigmine (EXELON) 13.3 MG/24HR PT24   Transdermal   Place 13.3 mg onto the skin daily.         Marland Kitchen rOPINIRole (REQUIP) 3 MG tablet   Oral   Take 3 mg by mouth at bedtime.         . vitamin B-12 (CYANOCOBALAMIN) 500 MCG tablet   Oral   Take 500 mcg by mouth daily.         . vitamin C (ASCORBIC ACID) 500 MG tablet   Oral   Take 500 mg by mouth daily.             BP 138/71  Pulse 69  Temp(Src) 98.3 F (36.8 C) (Oral)  Resp 18  SpO2 100%  Physical Exam  Nursing note and vitals reviewed. Constitutional: He appears well-developed and well-nourished. No distress.  HENT:  Mouth/Throat: Oropharynx is clear and moist.  Patient with 3 cm linear laceration to his left anterior pinna.  Bleeding appears controlled.  It does not appear to penetrate the cartilage.  Patient has small laceration to his left upper lip.  Teeth appear intact.  Patient has normal bite.  Eyes: Conjunctivae and EOM are normal. Pupils are equal, round, and reactive to light.  Neck: Normal range of motion. Neck supple.  No JVD present. No tracheal deviation present. No thyromegaly present.  Cardiovascular: Normal rate, regular rhythm, normal heart sounds and intact distal pulses.  Exam reveals no gallop and no friction rub.   No murmur heard. Pulmonary/Chest: Effort normal and breath sounds normal. No stridor. No respiratory distress. He has no wheezes. He has no rales. He exhibits no tenderness.  Abdominal: Soft. Bowel sounds are normal. He exhibits no distension and no mass. There is no tenderness. There is no rebound and no guarding.  Musculoskeletal: Normal range of motion. He exhibits no edema.  Patient with bruising over the dorsum of his left hand with some deformity to his fifth MCP.  Patient also noted to have near total avulsion of his fifth toenail  Lymphadenopathy:    He has no cervical adenopathy.  Neurological: He is alert.  Oriented to self only  Skin: Skin is warm and dry. No rash noted. No erythema. No pallor.    ED Course  Procedures (including critical care time)  LACERATION REPAIR Performed by: Olivia Mackie Authorized by: Olivia Mackie Consent: Verbal consent obtained. Risks and benefits: risks, benefits and alternatives were discussed Consent given by: patient Patient identity confirmed: provided demographic data Prepped and Draped in normal sterile fashion Wound explored  Laceration Location: left anterior superior ear  Laceration Length: 4 cm  No Foreign Bodies seen or palpated  Anesthesia: local infiltration  Local anesthetic: lidocaine 1%   Anesthetic total: 3 ml  Irrigation method: syringe Amount of cleaning: standard  Skin closure: 5.0 prolene  Number of sutures: 10  Technique: simple interrupted  Patient tolerance: Patient tolerated the procedure well with no immediate complications.   Labs Reviewed  GLUCOSE, CAPILLARY   Dg Chest 1 View  03/05/2013  *RADIOLOGY REPORT*  Clinical Data: Hypoglycemia, fall  CHEST - 1 VIEW  Comparison: None.  Findings:  Heart silhouette is enlarged.  The aorta is ectatic.  No effusion, infiltrate, or pneumothorax.  There is  mild left basilar atelectasis. No acute osseous abnormality.  IMPRESSION:  1.  No acute cardiopulmonary findings. 2.  Mild left basilar atelectasis.   Original Report Authenticated By: Genevive Bi, M.D.    Ct Head Wo Contrast  03/06/2013  *RADIOLOGY REPORT*  Clinical Data:  Fall, ear and lip laceration.  CT HEAD WITHOUT CONTRAST CT MAXILLOFACIAL WITHOUT CONTRAST  Technique:  Multidetector CT imaging of the head and maxillofacial structures were performed using the standard protocol without intravenous contrast. Multiplanar CT image reconstructions of the maxillofacial structures were also generated.  Comparison:  03/05/2013  CT HEAD  Findings: Prominence of the sulci, cisterns, and ventricles, in keeping with volume loss. There are subcortical and periventricular white matter hypodensities, a nonspecific finding most often seen with chronic microangiopathic changes.  There is no evidence for acute hemorrhage, overt hydrocephalus, mass lesion, or abnormal extra-axial fluid collection.  No definite CT evidence for acute cortical based (large artery) infarction. The visualized paranasal sinuses and mastoid air cells are predominately clear.  No displaced calvarial fracture.  IMPRESSION: Volume loss and white matter changes are similar to prior.  No CT evidence of acute intracranial abnormality.  CT MAXILLOFACIAL  Findings:   Globes are symmetric.  Lenses located.  No retrobulbar hematoma.  The orbital and sinus walls are intact.  Paranasal sinuses are clear.  Intact pterygoid plates and zygomatic arches. Intact nasal bones and nasal septum.  Located temporomandibular joints.  No mandible fracture. Degenerative changes of the upper cervical spine.  IMPRESSION: No acute maxillofacial bone fracture.   Original Report Authenticated By: Jearld Lesch, M.D.    Ct Head Wo Contrast  03/05/2013  *RADIOLOGY  REPORT*  Clinical Data:  Hypoglycemia, fall  CT HEAD WITHOUT CONTRAST CT CERVICAL SPINE WITHOUT CONTRAST  Technique:  Multidetector CT imaging of the head and cervical spine was performed following the standard protocol without intravenous contrast.  Multiplanar CT image reconstructions of the cervical spine were also generated.  Comparison:  Head CT 12/11/2012  CT HEAD  Findings: No acute intracranial hemorrhage.  No focal mass lesion. No CT evidence of acute infarction.   No midline shift or mass effect.  No hydrocephalus.  Basilar cisterns are patent.  There is extensive subcortical white matter hypodensities.  There is extensive atrophy and ventricular dilatation similar to prior.  Paranasal sinuses and mastoid air cells are clear.  Orbits are normal.  IMPRESSION:  1.  No acute intracranial trauma.  2.  Extensive  atrophy and microvascular disease are unchanged from prior.  CT CERVICAL SPINE  Findings: No prevertebral soft tissue swelling.  Normal alignment of cervical vertebral bodies.  No loss of vertebral body height. Normal facet articulation.  Normal craniocervical junction.  No evidence epidural or paraspinal hematoma.  There are multiple levels of endplate osteophytosis joint space narrowing.  IMPRESSION:  1.  No acute cervical spine fracture. 2.  Multilevel disc osteophytic disease.   Original Report Authenticated By: Genevive Bi, M.D.    Ct Cervical Spine Wo Contrast  03/05/2013  *RADIOLOGY REPORT*  Clinical Data:  Hypoglycemia, fall  CT HEAD WITHOUT CONTRAST CT CERVICAL SPINE WITHOUT CONTRAST  Technique:  Multidetector CT imaging of the head and cervical spine was performed following the standard protocol without intravenous contrast.  Multiplanar CT image reconstructions of the cervical spine were also generated.  Comparison:  Head CT 12/11/2012  CT HEAD  Findings: No acute intracranial hemorrhage.  No focal mass lesion. No CT evidence of acute infarction.   No midline shift or  mass effect.  No  hydrocephalus.  Basilar cisterns are patent.  There is extensive subcortical white matter hypodensities.  There is extensive atrophy and ventricular dilatation similar to prior.  Paranasal sinuses and mastoid air cells are clear.  Orbits are normal.  IMPRESSION:  1.  No acute intracranial trauma.  2.  Extensive  atrophy and microvascular disease are unchanged from prior.  CT CERVICAL SPINE  Findings: No prevertebral soft tissue swelling.  Normal alignment of cervical vertebral bodies.  No loss of vertebral body height. Normal facet articulation.  Normal craniocervical junction.  No evidence epidural or paraspinal hematoma.  There are multiple levels of endplate osteophytosis joint space narrowing.  IMPRESSION:  1.  No acute cervical spine fracture. 2.  Multilevel disc osteophytic disease.   Original Report Authenticated By: Genevive Bi, M.D.    Dg Hand Complete Left  03/06/2013  *RADIOLOGY REPORT*  Clinical Data: Pain  LEFT HAND - COMPLETE 3+ VIEW  Comparison: None.  Findings: Diffuse osteopenia.  There are degenerative changes of the STT and first CMC joints.  Mild ulnar subluxation at the fifth metacarpal phalangeal joint may be positioning or secondary to injury. There is dorsal tilt of the lunate on the lateral view.  No acute displaced fracture identified.  IMPRESSION: Diffuse osteopenia. No displaced fracture identified.If clinical concern for a fracture persists, recommend a repeat radiograph in 5- 10 days to evaluate for interval change or callus formation.  Mild ulnar subluxation at the fifth metacarpal phalangeal joint may be positioning or secondary to injury.  Correlate with range of motion.  First carpal metacarpal and STT joint degenerative changes.  Dorsal tilt of the lunate may reflect carpal instability.   Original Report Authenticated By: Jearld Lesch, M.D.    Dg Foot Complete Right  03/06/2013  *RADIOLOGY REPORT*  Clinical Data: Fall, nail injury fifth toe, bruising across first  through fifth metatarsals  RIGHT FOOT COMPLETE - 3+ VIEW  Comparison: None  Findings: Lateral plate and multiple screws identified at distal fibula. Bones appear diffusely demineralized. Joint spaces preserved. No acute fracture or dislocation. Lucency is identified at the distal aspect of the calcaneus on the oblique view, not identified on remaining two views.  IMPRESSION: Osseous demineralization. Prior ORIF distal fibula. Questionable lucency at the distal right calcaneus on oblique view, not seen on remaining views. Significance is uncertain but a destructive process of the distal calcaneus is not entirely excluded; consider follow-up CT or MR imaging to evaluate.   Original Report Authenticated By: Ulyses Southward, M.D.    Ct Maxillofacial Wo Cm  03/06/2013  *RADIOLOGY REPORT*  Clinical Data:  Fall, ear and lip laceration.  CT HEAD WITHOUT CONTRAST CT MAXILLOFACIAL WITHOUT CONTRAST  Technique:  Multidetector CT imaging of the head and maxillofacial structures were performed using the standard protocol without intravenous contrast. Multiplanar CT image reconstructions of the maxillofacial structures were also generated.  Comparison:  03/05/2013  CT HEAD  Findings: Prominence of the sulci, cisterns, and ventricles, in keeping with volume loss. There are subcortical and periventricular white matter hypodensities, a nonspecific finding most often seen with chronic microangiopathic changes.  There is no evidence for acute hemorrhage, overt hydrocephalus, mass lesion, or abnormal extra-axial fluid collection.  No definite CT evidence for acute cortical based (large artery) infarction. The visualized paranasal sinuses and mastoid air cells are predominately clear.  No displaced calvarial fracture.  IMPRESSION: Volume loss and white matter changes are similar to prior.  No CT evidence of acute intracranial abnormality.  CT MAXILLOFACIAL  Findings:   Globes are symmetric.  Lenses located.  No retrobulbar hematoma.  The  orbital and sinus walls are intact.  Paranasal sinuses are clear.  Intact pterygoid plates and zygomatic arches. Intact nasal bones and nasal septum.  Located temporomandibular joints.  No mandible fracture. Degenerative changes of the upper cervical spine.  IMPRESSION: No acute maxillofacial bone fracture.   Original Report Authenticated By: Jearld Lesch, M.D.      1. Fall at nursing home, initial encounter   2. Laceration of auricle of left ear, initial encounter   3. Periorbital contusion of left eye, initial encounter   4. Avulsion of toenail, initial encounter   5. Dementia   6. Laceration of lip without complication, initial encounter   7. Contusion of left hand, initial encounter       MDM  79 year old man status post fall with injuries listed above.  We'll plan for repair of his injuries.  We'll reorder CT scans given his fall and get x-rays of his foot and hand.   4:48 AM Laceration to ear completed.  Xrays of hand show possible subluxation of 5th metacarpal and lunar tilt.  No pain on palpation, normal ROM.  Will have nursing home md re-eval in 5 days.         Olivia Mackie, MD 03/06/13 [phone]

## 2013-03-06 NOTE — ED Notes (Signed)
ZOX:[id number]<AV> Expected date:03/06/13<BR> Expected time:12:06 AM<BR> Means of arrival:Ambulance<BR> Comments:<BR> Fall/head injury

## 2013-03-06 NOTE — ED Notes (Signed)
Per EMS pt comes from [facility] after pt fell tonight  Pt has a laceration to his left ear, upper lip, left eyebrow area  Pt has injury to his right foot little toe and left hand   Toenail is ripped off his 5th toe on the right foot   Pt's left hand is swollen and bruised   Pt was just released from here earlier tonight

## 2013-03-06 NOTE — ED Notes (Signed)
MD at bedside. 

## 2013-03-25 ENCOUNTER — Emergency Department (HOSPITAL_COMMUNITY): Payer: MEDICARE

## 2013-03-25 ENCOUNTER — Encounter (HOSPITAL_COMMUNITY): Payer: Self-pay | Admitting: Nurse Practitioner

## 2013-03-25 ENCOUNTER — Emergency Department (HOSPITAL_COMMUNITY)
Admission: EM | Admit: 2013-03-25 | Discharge: 2013-03-26 | Disposition: A | Discharge status: Skilled Nursing Facility | Payer: MEDICARE | Attending: Emergency Medicine | Admitting: Emergency Medicine

## 2013-03-25 DIAGNOSIS — Y939 Activity, unspecified: Secondary | ICD-10-CM | POA: Insufficient documentation

## 2013-03-25 DIAGNOSIS — S0101XA Laceration without foreign body of scalp, initial encounter: Secondary | ICD-10-CM

## 2013-03-25 DIAGNOSIS — Z7982 Long term (current) use of aspirin: Secondary | ICD-10-CM | POA: Insufficient documentation

## 2013-03-25 DIAGNOSIS — F0390 Unspecified dementia without behavioral disturbance: Secondary | ICD-10-CM | POA: Insufficient documentation

## 2013-03-25 DIAGNOSIS — S0100XA Unspecified open wound of scalp, initial encounter: Secondary | ICD-10-CM | POA: Insufficient documentation

## 2013-03-25 DIAGNOSIS — F3289 Other specified depressive episodes: Secondary | ICD-10-CM | POA: Insufficient documentation

## 2013-03-25 DIAGNOSIS — F329 Major depressive disorder, single episode, unspecified: Secondary | ICD-10-CM | POA: Insufficient documentation

## 2013-03-25 DIAGNOSIS — G20A1 Parkinson's disease without dyskinesia, without mention of fluctuations: Secondary | ICD-10-CM | POA: Insufficient documentation

## 2013-03-25 DIAGNOSIS — Z85038 Personal history of other malignant neoplasm of large intestine: Secondary | ICD-10-CM | POA: Insufficient documentation

## 2013-03-25 DIAGNOSIS — N390 Urinary tract infection, site not specified: Secondary | ICD-10-CM | POA: Insufficient documentation

## 2013-03-25 DIAGNOSIS — W19XXXA Unspecified fall, initial encounter: Secondary | ICD-10-CM

## 2013-03-25 DIAGNOSIS — Y9210 Unspecified residential institution as the place of occurrence of the external cause: Secondary | ICD-10-CM | POA: Insufficient documentation

## 2013-03-25 DIAGNOSIS — R296 Repeated falls: Secondary | ICD-10-CM | POA: Insufficient documentation

## 2013-03-25 DIAGNOSIS — Z79899 Other long term (current) drug therapy: Secondary | ICD-10-CM | POA: Insufficient documentation

## 2013-03-25 DIAGNOSIS — E785 Hyperlipidemia, unspecified: Secondary | ICD-10-CM | POA: Insufficient documentation

## 2013-03-25 DIAGNOSIS — G20 Parkinson's disease: Secondary | ICD-10-CM | POA: Insufficient documentation

## 2013-03-25 LAB — POCT I-STAT, CHEM 8
Creatinine, Ser: 0.9 mg/dL (ref 0.50–1.35)
Glucose, Bld: 96 mg/dL (ref 70–99)
Hemoglobin: 11.6 g/dL — ABNORMAL LOW (ref 13.0–17.0)
Potassium: 4 mEq/L (ref 3.5–5.1)
TCO2: 26 mmol/L (ref 0–100)

## 2013-03-25 LAB — URINALYSIS, ROUTINE W REFLEX MICROSCOPIC
Bilirubin Urine: NEGATIVE
Hgb urine dipstick: NEGATIVE
Specific Gravity, Urine: 1.015 (ref 1.005–1.030)
Urobilinogen, UA: 0.2 mg/dL (ref 0.0–1.0)

## 2013-03-25 LAB — URINE MICROSCOPIC-ADD ON

## 2013-03-25 MED ORDER — CEPHALEXIN 500 MG PO CAPS: 500.0000 mg | ORAL_CAPSULE | Freq: Four times a day (QID) | ORAL | Status: DC

## 2013-03-25 MED ORDER — SODIUM CHLORIDE 0.9 % IV BOLUS (SEPSIS)
1000.0000 mL | Freq: Once | INTRAVENOUS | Status: AC
  Administered 2013-03-25: 1000 mL via INTRAVENOUS

## 2013-03-25 NOTE — ED Notes (Signed)
Per EMS pt witnessed fall using chair as a walker, pt fell backwards. Lac to back of head. Bleeding controlled. No LOC. Pt. Alert. Hx dementia. From [facility].

## 2013-03-25 NOTE — ED Provider Notes (Signed)
History     CSN: [id number]  Arrival date & time 03/25/13  1925   None     Chief Complaint  Patient presents with  . Fall    (Consider location/radiation/quality/duration/timing/severity/associated sxs/prior treatment) HPI History provided by patient's friend, nursing home staff and patient.  Pt has h/o parkinson's and dementia.  Per prior chart, pt evaluated for fall, in which he layed on floor for 2 days, in ED on 03/05/13.  His friend and primary caregiver decided that she could no longer care for him and he was placed in memory care unit at [facility].   He is currently wheelchair bound.  Per nursing staff at [facility], patient stood up on his own and was attempting to walk this evening, he had a witnessed fall in which he landed on his back, hitting his head, and there was no LOC.  He had no complaints afterwards.  No recent illnesses including fever, cough, dyspnea, vomiting, diarrhea, but he has been agitated for the last 2 days.  Refused his dinner this evening and isn't drinking as many fluids.   Past Medical History  Diagnosis Date  . Hyperlipidemia   . Fatigue   . Parkinson disease     Sees neurology in [place]  . Depression   . Dementia     sees neurology in [place]  . Alcohol abuse   . Colon cancer     Last colonoscopy November 2011,  . Colon cancer     Past Surgical History  Procedure Laterality Date  . Spinal fusion      back surgery for spinal stenosis  . Shoulder arthroscopy  4/11    s/p  . Colectomy      Family History  Problem Relation Age of Onset  . Liver cancer Brother   . Diabetes Neg Hx   . Coronary artery disease Neg Hx     History  Substance Use Topics  . Smoking status: Never Smoker   . Smokeless tobacco: Never Used  . Alcohol Use: No      Review of Systems  All other systems reviewed and are negative.    Allergies  Review of patient's allergies indicates no known allergies.  Home Medications   Current Outpatient Rx  Name   Route  Sig  Dispense  Refill  . aspirin EC 81 MG tablet   Oral   Take 81 mg by mouth at bedtime.          . carbidopa-levodopa (PARCOPA) 25-250 MG per disintegrating tablet   Oral   Take 1 tablet by mouth 4 (four) times daily.         . citalopram (CELEXA) 20 MG tablet   Oral   Take 20 mg by mouth daily.         . cyanocobalamin 500 MCG tablet   Oral   Take 500 mcg by mouth every morning.         . entacapone (COMTAN) 200 MG tablet   Oral   Take 200 mg by mouth 4 (four) times daily.          . fish oil-omega-3 fatty acids 1000 MG capsule   Oral   Take 1 g by mouth daily.          . memantine (NAMENDA) 10 MG tablet   Oral   Take 10 mg by mouth 2 (two) times daily.         . Multiple Vitamin (MULTIVITAMIN WITH MINERALS) TABS   Oral  Take 1 tablet by mouth daily.         . pravastatin (PRAVACHOL) 20 MG tablet   Oral   Take 20 mg by mouth at bedtime.          . Rivastigmine (EXELON) 13.3 MG/24HR PT24   Transdermal   Place 13.3 mg onto the skin daily.         Marland Kitchen rOPINIRole (REQUIP) 3 MG tablet   Oral   Take 3 mg by mouth at bedtime.         . vitamin C (ASCORBIC ACID) 500 MG tablet   Oral   Take 500 mg by mouth daily.             BP 119/71  Pulse 75  Temp(Src) 99 F (37.2 C) (Oral)  Resp 16  SpO2 100%  Physical Exam  Nursing note and vitals reviewed. Constitutional: He is oriented to person, place, and time. He appears well-developed and well-nourished. No distress.  HENT:  Head: Normocephalic and atraumatic.  2cm subq, clean and hemostatic lac on posterior scalp.  No hematoma.  No tenderness.   Eyes:  Normal appearance  Neck: Normal range of motion.  Cardiovascular: Normal rate, regular rhythm and intact distal pulses.   Pulmonary/Chest: Effort normal. No respiratory distress.  Diminished breath sound base and mid left lung.   Abdominal: Soft. Bowel sounds are normal. He exhibits no distension. There is no tenderness.   Musculoskeletal: Normal range of motion.  Entire spine non-tender.  Pelvis stable.  Full, active ROM of all joints.  Small, hemostatic, superficial skin tear to L elbow.    Neurological: He is alert and oriented to person, place, and time.  CN 3-12 intact.  No sensory deficits.  5/5 and equal upper and lower extremity strength.  Skin: Skin is warm and dry. No rash noted.  Psychiatric: He has a normal mood and affect. His behavior is normal.    ED Course  Procedures (including critical care time)  Labs Reviewed  URINALYSIS, ROUTINE W REFLEX MICROSCOPIC - Abnormal; Notable for the following:    Color, Urine AMBER (*)    APPearance TURBID (*)    Ketones, ur 15 (*)    Protein, ur 30 (*)    Nitrite POSITIVE (*)    Leukocytes, UA LARGE (*)    All other components within normal limits  URINE MICROSCOPIC-ADD ON - Abnormal; Notable for the following:    Bacteria, UA MANY (*)    All other components within normal limits  POCT I-STAT, CHEM 8 - Abnormal; Notable for the following:    Hemoglobin 11.6 (*)    HCT 34.0 (*)    All other components within normal limits  URINE CULTURE   Dg Chest 2 View  03/25/2013  *RADIOLOGY REPORT*  Clinical Data: Pain after fall.  CHEST - 2 VIEW  Comparison: 03/05/2013  Findings: Thoracic scoliosis convex towards the right.  Slightly shallow inspiration. The heart size and pulmonary vascularity are normal. The lungs appear clear and expanded without focal air space disease or consolidation. No blunting of the costophrenic angles. No pneumothorax. Degenerative changes in the thoracic spine. Tortuous and calcified aorta.  No significant change since previous study.  IMPRESSION: No evidence of active pulmonary disease.   Original Report Authenticated By: Burman Nieves, M.D.    Dg Thoracic Spine 2 View  03/25/2013  *RADIOLOGY REPORT*  Clinical Data: Pain after fall.  THORACIC SPINE - 2 VIEW  Comparison: None.  Findings: Thoracolumbar scoliosis convex towards the  right.  No anterior subluxation of thoracic vertebrae.  Degenerative changes with narrowed interspaces and endplate hypertrophic changes.  No significant vertebral compression deformities.  Bone cortex and trabecular architecture appear intact.  No paraspinal soft tissue swelling.  Incidental note of extensive vascular calcifications in the upper abdomen.  IMPRESSION: Thoracolumbar scoliosis and degenerative changes.  No displaced fractures identified.   Original Report Authenticated By: Burman Nieves, M.D.    Dg Lumbar Spine Complete  03/25/2013  *RADIOLOGY REPORT*  Clinical Data: Back pain after fall.  LUMBAR SPINE - COMPLETE 4+ VIEW  Comparison: 12/11/2012  Findings: Lumbar scoliosis convex towards the left.  Five lumbar vertebrae.  Normal alignment of the lumbar vertebrae and facet joints.  There is compression of the L1 vertebra which has progressed since the previous study.  Mild retrolisthesis of fracture fragments is suggested.  Acute progression is not excluded.  No new compression fractures are demonstrated.  Diffuse degenerative changes with narrowed lumbar interspaces and associated plate hypertrophic changes.  Degenerative changes in the facet joints.  Incidental note of extensive vascular calcifications in the abdominal aorta and upper abdominal vessels.  IMPRESSION: Increasing compression of the L1 vertebra since previous study. Acute progression of compression fractures not excluded.  Mild retropulsion of fracture fragments.   Original Report Authenticated By: Burman Nieves, M.D.    Ct Head Wo Contrast  03/25/2013  *RADIOLOGY REPORT*  Clinical Data:  Fall  CT HEAD WITHOUT CONTRAST CT CERVICAL SPINE WITHOUT CONTRAST  Technique:  Multidetector CT imaging of the head and cervical spine was performed following the standard protocol without intravenous contrast.  Multiplanar CT image reconstructions of the cervical spine were also generated.  Comparison:  03/06/2013  CT HEAD  Findings: Global  atrophy.  Chronic ischemic changes.  Stable ventriculomegaly.  No mass effect, midline shift, or acute intracranial hemorrhage.  Mastoid air cells and visualized paranasal sinuses are clear.  Intact cranium.  IMPRESSION: No acute intracranial pathology.  CT CERVICAL SPINE  Findings: No acute fracture and no dislocation.  Advanced degenerative changes throughout the cervical spine are stable.  No obvious soft tissue injury in the neck.  IMPRESSION: No acute bony pathology.   Original Report Authenticated By: Jolaine Click, M.D.    Ct Cervical Spine Wo Contrast  03/25/2013  *RADIOLOGY REPORT*  Clinical Data:  Fall  CT HEAD WITHOUT CONTRAST CT CERVICAL SPINE WITHOUT CONTRAST  Technique:  Multidetector CT imaging of the head and cervical spine was performed following the standard protocol without intravenous contrast.  Multiplanar CT image reconstructions of the cervical spine were also generated.  Comparison:  03/06/2013  CT HEAD  Findings: Global atrophy.  Chronic ischemic changes.  Stable ventriculomegaly.  No mass effect, midline shift, or acute intracranial hemorrhage.  Mastoid air cells and visualized paranasal sinuses are clear.  Intact cranium.  IMPRESSION: No acute intracranial pathology.  CT CERVICAL SPINE  Findings: No acute fracture and no dislocation.  Advanced degenerative changes throughout the cervical spine are stable.  No obvious soft tissue injury in the neck.  IMPRESSION: No acute bony pathology.   Original Report Authenticated By: Jolaine Click, M.D.      1. UTI (lower urinary tract infection)   2. Scalp laceration, initial encounter   3. Fall, initial encounter       MDM  79yo M w/ dementia and Parkinson's who is wheelchair bound and resides at memory care unit at [facility], presents to ED s/p witnessed fall w/ posterior head impact.  Pt has no complaints currently.  Per SNF, no recent illnesses.  On exam, afebrile, NAD, oriented at to baseline, tachycardic, 2cm lac posterior scalp,  no spinal tenderness, pelvis stable, no focal neuro deficits.  Labs, CT head and xrays of spine pending (low clinical suspicion for spinal fx but patient's friend concerned and requests them) as well as CXR (asymmetric breath sounds on exam).  Scalp lac cleaned by nursing staff and stapled by myself.  Suspect tetanus up to date since sustained multiple lacs in 11/2012.   Imaging significant for increasing compression of L1 vertebra which may or may not be acute.  Labs sig for UTI.  Results discussed w/ patient and his friend.  Tachycardia resolved w/ 1L NS bolus and I suspect that it was secondary to mild dehydration.  Pt d/c'd back to nursing home w/ keflex.  He receives tylenol for pain prn.  Recommended PCP f/u and Return precautions discussed on discharge paperwork.        Otilio Miu, PA-C 03/26/13 [phone]

## 2013-03-25 NOTE — ED Notes (Signed)
[facility] called to transport pt. To [facility]

## 2013-03-26 NOTE — ED Notes (Signed)
Attempted to call facility to give report x2 unable to reach staff.  [facility] memory care unit - [phone]

## 2013-03-26 NOTE — ED Provider Notes (Signed)
Medical screening examination/treatment/procedure(s) were conducted as a shared visit with non-physician practitioner(s) and myself.  I personally evaluated the patient during the encounter  Ethelda Chick, MD 03/26/13 [phone]

## 2013-03-28 LAB — URINE CULTURE: Colony Count: >=100000

## 2013-03-29 ENCOUNTER — Telehealth (HOSPITAL_COMMUNITY): Payer: Self-pay | Admitting: Emergency Medicine

## 2013-03-29 NOTE — ED Notes (Signed)
+  Urine. Patient treated with Keflex. Sensitive to same. Per protocol MD. °

## 2013-03-29 NOTE — ED Notes (Signed)
Patient has +Urine culture. Checking to see if appropriately treated. °

## 2013-04-01 ENCOUNTER — Emergency Department (HOSPITAL_COMMUNITY): Payer: MEDICARE

## 2013-04-01 ENCOUNTER — Encounter (HOSPITAL_COMMUNITY): Payer: Self-pay

## 2013-04-01 ENCOUNTER — Emergency Department (HOSPITAL_COMMUNITY)
Admission: EM | Admit: 2013-04-01 | Discharge: 2013-04-02 | Disposition: A | Discharge status: Home / Self Care | Payer: MEDICARE | Attending: Emergency Medicine | Admitting: Emergency Medicine

## 2013-04-01 DIAGNOSIS — F3289 Other specified depressive episodes: Secondary | ICD-10-CM | POA: Insufficient documentation

## 2013-04-01 DIAGNOSIS — S0990XA Unspecified injury of head, initial encounter: Secondary | ICD-10-CM | POA: Insufficient documentation

## 2013-04-01 DIAGNOSIS — S61219A Laceration without foreign body of unspecified finger without damage to nail, initial encounter: Secondary | ICD-10-CM

## 2013-04-01 DIAGNOSIS — Y9389 Activity, other specified: Secondary | ICD-10-CM | POA: Insufficient documentation

## 2013-04-01 DIAGNOSIS — Y9210 Unspecified residential institution as the place of occurrence of the external cause: Secondary | ICD-10-CM | POA: Insufficient documentation

## 2013-04-01 DIAGNOSIS — F0390 Unspecified dementia without behavioral disturbance: Secondary | ICD-10-CM | POA: Insufficient documentation

## 2013-04-01 DIAGNOSIS — Z8639 Personal history of other endocrine, nutritional and metabolic disease: Secondary | ICD-10-CM | POA: Insufficient documentation

## 2013-04-01 DIAGNOSIS — W07XXXA Fall from chair, initial encounter: Secondary | ICD-10-CM | POA: Insufficient documentation

## 2013-04-01 DIAGNOSIS — G20 Parkinson's disease: Secondary | ICD-10-CM | POA: Insufficient documentation

## 2013-04-01 DIAGNOSIS — G20A1 Parkinson's disease without dyskinesia, without mention of fluctuations: Secondary | ICD-10-CM | POA: Insufficient documentation

## 2013-04-01 DIAGNOSIS — F329 Major depressive disorder, single episode, unspecified: Secondary | ICD-10-CM | POA: Insufficient documentation

## 2013-04-01 DIAGNOSIS — S61209A Unspecified open wound of unspecified finger without damage to nail, initial encounter: Secondary | ICD-10-CM | POA: Insufficient documentation

## 2013-04-01 DIAGNOSIS — E785 Hyperlipidemia, unspecified: Secondary | ICD-10-CM | POA: Insufficient documentation

## 2013-04-01 DIAGNOSIS — Z79899 Other long term (current) drug therapy: Secondary | ICD-10-CM | POA: Insufficient documentation

## 2013-04-01 DIAGNOSIS — Z85038 Personal history of other malignant neoplasm of large intestine: Secondary | ICD-10-CM | POA: Insufficient documentation

## 2013-04-01 DIAGNOSIS — Z862 Personal history of diseases of the blood and blood-forming organs and certain disorders involving the immune mechanism: Secondary | ICD-10-CM | POA: Insufficient documentation

## 2013-04-01 HISTORY — DX: Hyperglycemia, unspecified: R73.9

## 2013-04-01 HISTORY — DX: Unspecified fall, initial encounter: W19.XXXA

## 2013-04-01 NOTE — ED Notes (Signed)
Patient reports falling while trying to move from one chair to another. Denies dizziness, LOC. Patient able to recall the events prior to and after fall. Denies any neck, back or head pain. Has a small hematoma to left forehead. Old bruising noted to left check and left upper chest, presumably from previous fall 2 weeks ago.

## 2013-04-01 NOTE — ED Notes (Addendum)
Patient from [facility]. Had a witnessed fall from wheelchair. No LOC. Left forehead hematoma. Not bleeding. Not on blood thinners. Also has a laceration to left middle finger, bleeding controlled PTA. Denies any head, neck or back pain. To note, patient has staples to posterior scalp from a fall 2 weeks ago.

## 2013-04-01 NOTE — ED Notes (Signed)
Patient transported to CT 

## 2013-04-02 NOTE — ED Provider Notes (Signed)
History    86yM presenting after fall. Fell from near seated position when transitioning. Did hit his head. NO report of LOC. At baseline mental status. On baby ASA. No vomiting. Pt denies pain anywhere.   CSN: [id number]  Arrival date & time 04/01/13  2121   First MD Initiated Contact with Patient 04/01/13 2147      Chief Complaint  Patient presents with  . Fall    (Consider location/radiation/quality/duration/timing/severity/associated sxs/prior treatment) HPI  Past Medical History  Diagnosis Date  . Hyperlipidemia   . Fatigue   . Parkinson disease     Sees neurology in [place]  . Depression   . Dementia     sees neurology in [place]  . Alcohol abuse   . Colon cancer     Last colonoscopy November 2011,  . Colon cancer   . Hyperglycemia   . Fall     Past Surgical History  Procedure Laterality Date  . Spinal fusion      back surgery for spinal stenosis  . Shoulder arthroscopy  4/11    s/p  . Colectomy      Family History  Problem Relation Age of Onset  . Liver cancer Brother   . Diabetes Neg Hx   . Coronary artery disease Neg Hx     History  Substance Use Topics  . Smoking status: Never Smoker   . Smokeless tobacco: Never Used  . Alcohol Use: No      Review of Systems  Level 5 caveat applies because pt had dementia and unable to provide much useful hx.   Allergies  Review of patient's allergies indicates no known allergies.  Home Medications   Current Outpatient Rx  Name  Route  Sig  Dispense  Refill  . acetaminophen (TYLENOL) 500 MG tablet   Oral   Take 1,000 mg by mouth every 8 (eight) hours as needed for pain.         Marland Kitchen aspirin EC 81 MG tablet   Oral   Take 81 mg by mouth at bedtime.          . carbidopa-levodopa (PARCOPA) 25-250 MG per disintegrating tablet   Oral   Take 1 tablet by mouth 4 (four) times daily.         . cephALEXin (KEFLEX) 500 MG capsule   Oral   Take 500 mg by mouth 4 (four) times daily.         . citalopram  (CELEXA) 20 MG tablet   Oral   Take 20 mg by mouth daily.         . cyanocobalamin 500 MCG tablet   Oral   Take 500 mcg by mouth every morning.         . entacapone (COMTAN) 200 MG tablet   Oral   Take 200 mg by mouth 4 (four) times daily.          . fish oil-omega-3 fatty acids 1000 MG capsule   Oral   Take 1 g by mouth daily.          . furosemide (LASIX) 20 MG tablet   Oral   Take 20 mg by mouth daily.         . memantine (NAMENDA) 10 MG tablet   Oral   Take 10 mg by mouth 2 (two) times daily.         . Multiple Vitamins-Minerals (CENTRUM SILVER PO)   Oral   Take 1 tablet by mouth daily.         Marland Kitchen  pravastatin (PRAVACHOL) 20 MG tablet   Oral   Take 20 mg by mouth at bedtime.          . Rivastigmine (EXELON) 13.3 MG/24HR PT24   Transdermal   Place 13.3 mg onto the skin daily.         Marland Kitchen rOPINIRole (REQUIP) 3 MG tablet   Oral   Take 3 mg by mouth 3 (three) times daily.          . vitamin C (ASCORBIC ACID) 500 MG tablet   Oral   Take 500 mg by mouth daily.             BP 120/51  Pulse 68  Temp(Src) 97.7 F (36.5 C) (Oral)  Resp 18  SpO2 97%  Physical Exam  Nursing note and vitals reviewed. Constitutional: He appears well-developed and well-nourished. No distress.  HENT:  Head: Normocephalic.  Small abrasion to forehead and old appearing ecchymosis to L zygoma  Eyes: Conjunctivae are normal. Right eye exhibits no discharge. Left eye exhibits no discharge.  Neck: Neck supple.  Cardiovascular: Normal rate, regular rhythm and normal heart sounds.  Exam reveals no gallop and no friction rub.   No murmur heard. Pulmonary/Chest: Effort normal and breath sounds normal. No respiratory distress. He exhibits no tenderness.  Abdominal: Soft. He exhibits no distension. There is no tenderness.  Musculoskeletal: He exhibits no edema and no tenderness.  No midline spinal tenderness. No apparent bony tenderness of extremities or pain with ROM of  large joints.   Neurological: He is alert.  Disoriented to time. Follows commands. No obvious focal deficits.   Skin: Skin is warm and dry.  Stellate laceration with flapped component to ulnar/palmar aspect mid index finger. ~4.5 cm in total length. NV intact. Good ROM against resistance.   Psychiatric: He has a normal mood and affect. His behavior is normal. Thought content normal.    ED Course  NERVE BLOCK Date/Time: 04/01/2013 2:23 PM Performed by: Raeford Razor Authorized by: Raeford Razor Consent: Verbal consent obtained. Patient identity confirmed: verbally with patient, arm band and provided demographic data Time out: Immediately prior to procedure a "time out" was called to verify the correct patient, procedure, equipment, support staff and site/side marked as required. Indications: pain relief Body area: upper extremity Nerve: digital Laterality: left Patient sedated: no Preparation: Patient was prepped and draped in the usual sterile fashion. Patient position: sitting Needle gauge: 27 G Location technique: anatomical landmarks Local anesthetic: lidocaine 1% without epinephrine Anesthetic total: 3 ml Outcome: pain improved Patient tolerance: Patient tolerated the procedure well with no immediate complications.   (including critical care time)  LACERATION REPAIR Performed by: Raeford Razor Authorized by: Raeford Razor Consent: Verbal consent obtained. Risks and benefits: risks, benefits and alternatives were discussed Consent given by: patient Patient identity confirmed: provided demographic data Prepped and Draped in normal sterile fashion Wound explored  Laceration Location: L  index finger  Laceration Length: 4.5 cm  No Foreign Bodies seen or palpated  Anesthesia: local infiltration  Local anesthetic: lidocaine 1% w/o epinephrine  Anesthetic total: 3 ml  Irrigation method: syringe Amount of cleaning: standard  Skin closure: single layer, 4-0  mono  Number of sutures: 7  Technique: simple interrupted   Patient tolerance: Patient tolerated the procedure well with no immediate complications.   Labs Reviewed - No data to display Ct Head Wo Contrast  04/01/2013  *RADIOLOGY REPORT*  Clinical Data:  Status post fall from wheelchair.  Left forehead hematoma.  Concern  for head or cervical spine injury.  CT HEAD WITHOUT CONTRAST AND CT CERVICAL SPINE WITHOUT CONTRAST  Technique:  Multidetector CT imaging of the head and cervical spine was performed following the standard protocol without intravenous contrast.  Multiplanar CT image reconstructions of the cervical spine were also generated.  Comparison: CT of the head and cervical spine performed 03/25/2013  CT HEAD  Findings: There is no evidence of acute infarction, mass lesion, or intra- or extra-axial hemorrhage on CT.  Prominence of the ventricles and sulci reflects moderate cortical volume loss.  Cerebellar atrophy is noted.  Diffuse periventricular subcortical white matter change likely reflects small vessel ischemic microangiopathy.  The brainstem and fourth ventricle are within normal limits.  The basal ganglia are unremarkable in appearance.  The cerebral hemispheres demonstrate grossly normal gray-white differentiation. No mass effect or midline shift is seen.  There is no evidence of fracture; visualized osseous structures are unremarkable in appearance.  The orbits are within normal limits. The paranasal sinuses and mastoid air cells are well-aerated.  The patient's left forehead scalp hematoma is only minimally imaged. Skin staples are again seen at the left posterior vertex.  Cerumen is noted partially filling the left external auditory canal.  IMPRESSION:  1.  No evidence of traumatic intracranial injury or fracture. 2.  Left forehead scalp hematoma only minimally imaged; skin staples again seen at the left posterior vertex. 3.  Moderate cortical volume loss and diffuse small vessel  ischemic microangiopathy. 4.  Cerumen noted partially filling the left external auditory canal.  CT CERVICAL SPINE  Findings: There is no evidence of fracture or subluxation. Vertebral bodies demonstrate normal height and alignment.  There is diffuse narrowing of the intervertebral disc spaces along the cervical spine, with mild degenerative change about the dens. Prevertebral soft tissues are within normal limits.  The thyroid gland is unremarkable in appearance.  The visualized lung apices are clear.  Prominent calcification is noted along the proximal great vessels bilaterally, and there is dense calcification at the carotid bifurcations, which appear to be retropharyngeal in nature.  IMPRESSION:  1.  No evidence of fracture or subluxation along the cervical spine. 2.  Mild diffuse degenerative change along the cervical spine. 3.  Prominent calcification along the proximal great vessels bilaterally, and dense calcification at the carotid bifurcations. Carotid ultrasound would be helpful for further evaluation, when and as deemed clinically appropriate.   Original Report Authenticated By: Tonia Ghent, M.D.    Ct Cervical Spine Wo Contrast  04/01/2013  *RADIOLOGY REPORT*  Clinical Data:  Status post fall from wheelchair.  Left forehead hematoma.  Concern for head or cervical spine injury.  CT HEAD WITHOUT CONTRAST AND CT CERVICAL SPINE WITHOUT CONTRAST  Technique:  Multidetector CT imaging of the head and cervical spine was performed following the standard protocol without intravenous contrast.  Multiplanar CT image reconstructions of the cervical spine were also generated.  Comparison: CT of the head and cervical spine performed 03/25/2013  CT HEAD  Findings: There is no evidence of acute infarction, mass lesion, or intra- or extra-axial hemorrhage on CT.  Prominence of the ventricles and sulci reflects moderate cortical volume loss.  Cerebellar atrophy is noted.  Diffuse periventricular subcortical white  matter change likely reflects small vessel ischemic microangiopathy.  The brainstem and fourth ventricle are within normal limits.  The basal ganglia are unremarkable in appearance.  The cerebral hemispheres demonstrate grossly normal gray-white differentiation. No mass effect or midline shift is seen.  There is no  evidence of fracture; visualized osseous structures are unremarkable in appearance.  The orbits are within normal limits. The paranasal sinuses and mastoid air cells are well-aerated.  The patient's left forehead scalp hematoma is only minimally imaged. Skin staples are again seen at the left posterior vertex.  Cerumen is noted partially filling the left external auditory canal.  IMPRESSION:  1.  No evidence of traumatic intracranial injury or fracture. 2.  Left forehead scalp hematoma only minimally imaged; skin staples again seen at the left posterior vertex. 3.  Moderate cortical volume loss and diffuse small vessel ischemic microangiopathy. 4.  Cerumen noted partially filling the left external auditory canal.  CT CERVICAL SPINE  Findings: There is no evidence of fracture or subluxation. Vertebral bodies demonstrate normal height and alignment.  There is diffuse narrowing of the intervertebral disc spaces along the cervical spine, with mild degenerative change about the dens. Prevertebral soft tissues are within normal limits.  The thyroid gland is unremarkable in appearance.  The visualized lung apices are clear.  Prominent calcification is noted along the proximal great vessels bilaterally, and there is dense calcification at the carotid bifurcations, which appear to be retropharyngeal in nature.  IMPRESSION:  1.  No evidence of fracture or subluxation along the cervical spine. 2.  Mild diffuse degenerative change along the cervical spine. 3.  Prominent calcification along the proximal great vessels bilaterally, and dense calcification at the carotid bifurcations. Carotid ultrasound would be helpful  for further evaluation, when and as deemed clinically appropriate.   Original Report Authenticated By: Tonia Ghent, M.D.      1. Closed head injury, initial encounter   2. Finger laceration, initial encounter       MDM  86yM presenting after fall. Evidence of head/face trauma. Imaging neg for acute injury. Finger laceration w/o apparent bony tenderness. Copiously irrigated and closed. DC back to SNF. Outpt FU as needed and for suture removal.         Raeford Razor, MD 04/05/13 1429

## 2013-04-02 NOTE — ED Notes (Signed)
MD at bedside. 

## 2013-04-02 NOTE — ED Notes (Signed)
[facility] notified for transport back to SNF

## 2013-04-24 ENCOUNTER — Telehealth: Payer: Self-pay | Admitting: Internal Medicine

## 2013-04-24 NOTE — Telephone Encounter (Signed)
Please advise 

## 2013-04-24 NOTE — Telephone Encounter (Signed)
Yes, please schedule a OV, obtain records from the doctors making housecalls

## 2013-04-24 NOTE — Telephone Encounter (Signed)
Dale Wong states they are seeing doctors making house calls and are not pleased with the care. They would like to know if Dr. [name] would take the patient back. Call Ms. Greeson at home.

## 2013-04-25 NOTE — Telephone Encounter (Signed)
Spoke w/Ms. Dale Wong, Pt scheduled for June.

## 2013-05-05 ENCOUNTER — Other Ambulatory Visit: Payer: Self-pay | Admitting: Internal Medicine

## 2013-05-06 NOTE — Telephone Encounter (Signed)
Refill done.  

## 2013-05-28 ENCOUNTER — Other Ambulatory Visit: Payer: Self-pay | Admitting: Internal Medicine

## 2013-05-28 NOTE — Telephone Encounter (Signed)
Refill done.  

## 2013-06-02 ENCOUNTER — Encounter (HOSPITAL_COMMUNITY): Payer: Self-pay | Admitting: Emergency Medicine

## 2013-06-02 ENCOUNTER — Emergency Department (HOSPITAL_COMMUNITY): Payer: MEDICARE

## 2013-06-02 ENCOUNTER — Emergency Department (HOSPITAL_COMMUNITY)
Admission: EM | Admit: 2013-06-02 | Discharge: 2013-06-03 | Disposition: A | Discharge status: Nursing Home (Includes ICF) | Payer: MEDICARE | Attending: Emergency Medicine | Admitting: Emergency Medicine

## 2013-06-02 DIAGNOSIS — N390 Urinary tract infection, site not specified: Secondary | ICD-10-CM | POA: Insufficient documentation

## 2013-06-02 DIAGNOSIS — G20 Parkinson's disease: Secondary | ICD-10-CM | POA: Insufficient documentation

## 2013-06-02 DIAGNOSIS — F0280 Dementia in other diseases classified elsewhere without behavioral disturbance: Secondary | ICD-10-CM | POA: Insufficient documentation

## 2013-06-02 DIAGNOSIS — Z862 Personal history of diseases of the blood and blood-forming organs and certain disorders involving the immune mechanism: Secondary | ICD-10-CM | POA: Insufficient documentation

## 2013-06-02 DIAGNOSIS — F329 Major depressive disorder, single episode, unspecified: Secondary | ICD-10-CM | POA: Insufficient documentation

## 2013-06-02 DIAGNOSIS — R609 Edema, unspecified: Secondary | ICD-10-CM | POA: Insufficient documentation

## 2013-06-02 DIAGNOSIS — Z79899 Other long term (current) drug therapy: Secondary | ICD-10-CM | POA: Insufficient documentation

## 2013-06-02 DIAGNOSIS — R011 Cardiac murmur, unspecified: Secondary | ICD-10-CM | POA: Insufficient documentation

## 2013-06-02 DIAGNOSIS — G20A1 Parkinson's disease without dyskinesia, without mention of fluctuations: Secondary | ICD-10-CM | POA: Insufficient documentation

## 2013-06-02 DIAGNOSIS — F3289 Other specified depressive episodes: Secondary | ICD-10-CM | POA: Insufficient documentation

## 2013-06-02 DIAGNOSIS — Z85038 Personal history of other malignant neoplasm of large intestine: Secondary | ICD-10-CM | POA: Insufficient documentation

## 2013-06-02 DIAGNOSIS — E785 Hyperlipidemia, unspecified: Secondary | ICD-10-CM | POA: Insufficient documentation

## 2013-06-02 DIAGNOSIS — Z8639 Personal history of other endocrine, nutritional and metabolic disease: Secondary | ICD-10-CM | POA: Insufficient documentation

## 2013-06-02 DIAGNOSIS — Z7982 Long term (current) use of aspirin: Secondary | ICD-10-CM | POA: Insufficient documentation

## 2013-06-02 DIAGNOSIS — Z87828 Personal history of other (healed) physical injury and trauma: Secondary | ICD-10-CM | POA: Insufficient documentation

## 2013-06-02 LAB — URINALYSIS, ROUTINE W REFLEX MICROSCOPIC
Glucose, UA: NEGATIVE mg/dL
Hgb urine dipstick: NEGATIVE
Ketones, ur: 15 mg/dL — AB
Protein, ur: 30 mg/dL — AB

## 2013-06-02 LAB — URINE MICROSCOPIC-ADD ON

## 2013-06-02 LAB — CBC WITH DIFFERENTIAL/PLATELET
Basophils Absolute: 0 10*3/uL (ref 0.0–0.1)
Basophils Relative: 0 % (ref 0–1)
Eosinophils Absolute: 0 10*3/uL (ref 0.0–0.7)
Eosinophils Relative: 0 % (ref 0–5)
MCH: 28.7 pg (ref 26.0–34.0)
MCV: 85.8 fL (ref 78.0–100.0)
Platelets: 103 10*3/uL — ABNORMAL LOW (ref 150–400)
RDW: 16.4 % — ABNORMAL HIGH (ref 11.5–15.5)

## 2013-06-02 LAB — BASIC METABOLIC PANEL
BUN: 30 mg/dL — ABNORMAL HIGH (ref 6–23)
CO2: 30 mEq/L (ref 19–32)
Calcium: 9.5 mg/dL (ref 8.4–10.5)
Creatinine, Ser: 1.06 mg/dL (ref 0.50–1.35)
Glucose, Bld: 97 mg/dL (ref 70–99)

## 2013-06-02 LAB — APTT: aPTT: 34 seconds (ref 24–37)

## 2013-06-02 MED ORDER — DEXTROSE 5 % IV SOLN
1.0000 g | Freq: Once | INTRAVENOUS | Status: AC
  Administered 2013-06-02: 1 g via INTRAVENOUS
  Filled 2013-06-02: qty 10

## 2013-06-02 MED ORDER — CEPHALEXIN 500 MG PO CAPS: 500.0000 mg | ORAL_CAPSULE | Freq: Four times a day (QID) | ORAL | Status: DC

## 2013-06-02 MED ORDER — SODIUM CHLORIDE 0.9 % IV SOLN
Freq: Once | INTRAVENOUS | Status: AC
  Administered 2013-06-02: 22:00:00 via INTRAVENOUS

## 2013-06-02 NOTE — ED Provider Notes (Signed)
History     CSN: [id number]  Arrival date & time 06/02/13  2107   First MD Initiated Contact with Patient 06/02/13 2114      Chief Complaint  Patient presents with  . Hypotension    (Consider location/radiation/quality/duration/timing/severity/associated sxs/prior treatment) HPI Comments: 79 year old male with a history of Parkinson's disease, who presents with a complaint of hypotension. The nurses at the nursing home state that the patient had not been feeling well today, vague symptoms but then was hypotensive with a blood pressure of 90/50. It was slightly lower than that at the nursing facility. The patient does have Parkinson's but is able to tell me that he has not been feeling well, he is not more specific, he does have dementia. There is no report of vomiting, coughing or diarrhea  Level V caveat apply secondary to dementia  The history is provided by the patient.    Past Medical History  Diagnosis Date  . Hyperlipidemia   . Fatigue   . Parkinson disease     Sees neurology in [place]  . Depression   . Dementia     sees neurology in [place]  . Alcohol abuse   . Colon cancer     Last colonoscopy November 2011,  . Colon cancer   . Hyperglycemia   . Fall     Past Surgical History  Procedure Laterality Date  . Spinal fusion      back surgery for spinal stenosis  . Shoulder arthroscopy  4/11    s/p  . Colectomy      Family History  Problem Relation Age of Onset  . Liver cancer Brother   . Diabetes Neg Hx   . Coronary artery disease Neg Hx     History  Substance Use Topics  . Smoking status: Never Smoker   . Smokeless tobacco: Never Used  . Alcohol Use: No      Review of Systems  Unable to perform ROS: Dementia    Allergies  Review of patient's allergies indicates no known allergies.  Home Medications   Current Outpatient Rx  Name  Route  Sig  Dispense  Refill  . acetaminophen (TYLENOL) 500 MG tablet   Oral   Take 1,000 mg by mouth every 8 (eight)  hours as needed for pain.         Marland Kitchen aspirin EC 81 MG tablet   Oral   Take 81 mg by mouth at bedtime.          . carbidopa-levodopa (PARCOPA) 25-250 MG per disintegrating tablet   Oral   Take 1 tablet by mouth 4 (four) times daily.         . citalopram (CELEXA) 20 MG tablet   Oral   Take 20 mg by mouth daily.         . cyanocobalamin 500 MCG tablet   Oral   Take 500 mcg by mouth every morning.         . entacapone (COMTAN) 200 MG tablet   Oral   Take 200 mg by mouth 4 (four) times daily.          . fish oil-omega-3 fatty acids 1000 MG capsule   Oral   Take 1 g by mouth daily.          . furosemide (LASIX) 40 MG tablet   Oral   Take 40 mg by mouth daily.         Marland Kitchen LORazepam (ATIVAN) 1 MG tablet   Oral  Take 1 mg by mouth at bedtime.         . memantine (NAMENDA) 10 MG tablet   Oral   Take 10 mg by mouth 2 (two) times daily.         . Multiple Vitamins-Minerals (CENTRUM SILVER PO)   Oral   Take 1 tablet by mouth daily.         . pravastatin (PRAVACHOL) 20 MG tablet   Oral   Take 20 mg by mouth at bedtime.          . risperiDONE (RISPERDAL) 0.5 MG tablet   Oral   Take 0.5 mg by mouth every evening.         . Rivastigmine (EXELON) 13.3 MG/24HR PT24   Transdermal   Place 13.3 mg onto the skin daily.         Marland Kitchen rOPINIRole (REQUIP) 3 MG tablet   Oral   Take 3 mg by mouth 3 (three) times daily.          . simvastatin (ZOCOR) 20 MG tablet   Oral   Take 20 mg by mouth at bedtime.         . triamcinolone cream (KENALOG) 0.1 %   Topical   Apply 1 application topically 2 (two) times daily as needed (itching/rash).         . vitamin C (ASCORBIC ACID) 500 MG tablet   Oral   Take 500 mg by mouth daily.           Marland Kitchen zolpidem (AMBIEN) 10 MG tablet   Oral   Take 10 mg by mouth at bedtime.         . cephALEXin (KEFLEX) 500 MG capsule   Oral   Take 1 capsule (500 mg total) by mouth 4 (four) times daily.   28 capsule   0      BP 107/74  Pulse 66  Temp(Src) 98.6 F (37 C) (Oral)  Resp 22  SpO2 100%  Physical Exam  Nursing note and vitals reviewed. Constitutional: He appears well-developed and well-nourished. No distress.  HENT:  Head: Normocephalic and atraumatic.  Mouth/Throat: Oropharynx is clear and moist. No oropharyngeal exudate.  Mucous membranes are moist  Eyes: Conjunctivae and EOM are normal. Pupils are equal, round, and reactive to light. Right eye exhibits no discharge. Left eye exhibits no discharge. No scleral icterus.  Neck: Normal range of motion. Neck supple. No JVD present. No thyromegaly present.  Cardiovascular: Normal rate, regular rhythm and intact distal pulses.  Exam reveals no gallop and no friction rub.   Murmur ( Systolic) heard. Pulmonary/Chest: Effort normal and breath sounds normal. No respiratory distress. He has no wheezes. He has no rales.  Abdominal: Soft. Bowel sounds are normal. He exhibits no distension and no mass. There is no tenderness.  Musculoskeletal: Normal range of motion. He exhibits edema ( Bilateral pitting lower extremity edema, no asymmetry). He exhibits no tenderness.  Lymphadenopathy:    He has no cervical adenopathy.  Neurological: He is alert. Coordination normal.  Somnolent, arousable, family member or friend at the bedside states this is normal for him. Follows commands, able to help set up, speech is clear.  Skin: Skin is warm and dry. No rash noted. No erythema.  Psychiatric: He has a normal mood and affect. His behavior is normal.    ED Course  Procedures (including critical care time)  Labs Reviewed  CBC WITH DIFFERENTIAL - Abnormal; Notable for the following:    RBC 4.01 (*)  Hemoglobin 11.5 (*)    HCT 34.4 (*)    RDW 16.4 (*)    Platelets 103 (*)    All other components within normal limits  BASIC METABOLIC PANEL - Abnormal; Notable for the following:    Potassium 3.4 (*)    BUN 30 (*)    GFR calc non Af Amer 62 (*)    GFR calc  Af Amer 72 (*)    All other components within normal limits  URINALYSIS, ROUTINE W REFLEX MICROSCOPIC - Abnormal; Notable for the following:    Color, Urine ORANGE (*)    APPearance CLOUDY (*)    Bilirubin Urine SMALL (*)    Ketones, ur 15 (*)    Protein, ur 30 (*)    Nitrite POSITIVE (*)    Leukocytes, UA LARGE (*)    All other components within normal limits  URINE MICROSCOPIC-ADD ON - Abnormal; Notable for the following:    Bacteria, UA MANY (*)    Casts HYALINE CASTS (*)    All other components within normal limits  URINE CULTURE  APTT  PROTIME-INR  TROPONIN I   Dg Chest Port 1 View  06/02/2013   *RADIOLOGY REPORT*  Clinical Data: Chest pain, hypotensive  PORTABLE CHEST - 1 VIEW  Comparison: 03/25/2013  Findings: Atheromatous aortic arch. Lungs clear.  Heart size and pulmonary vascularity normal.  No effusion.  Visualized bones unremarkable.  IMPRESSION: No acute disease   Original Report Authenticated By: D. Andria Rhein, MD     1. UTI (lower urinary tract infection)       MDM  There does not appear to be any sources of overt infection, the patient is not hypotensive here, he has normal vital signs with a blood pressure of 120 and a pulse of 60. We will investigate with EKG, chest x-ray, labs including troponin and infectious type labs. Urinalysis pending.   ED ECG REPORT  I personally interpreted this EKG   Date: 06/02/2013   Rate: 69  Rhythm: normal sinus rhythm  QRS Axis: normal  Intervals: normal  ST/T Wave abnormalities: normal  Conduction Disutrbances:none  Narrative Interpretation:   Old EKG Reviewed: none available  Pt has labs that are overall unremarkable - other than UTI - this can be treated with IV abx to start followed by home with abx by mouth.  He does have normal pulse, no hypotension and no fever.  The pt appears very stable and the friend at the bedside states he is still at his baseline.     After a couple of hours of observation the pt has had no  decompensation and no hypotension.  He is in a NH and can be followed in that setting,   Meds given in ED:  Medications  cefTRIAXone (ROCEPHIN) 1 g in dextrose 5 % 50 mL IVPB (not administered)  0.9 %  sodium chloride infusion ( Intravenous New Bag/Given 06/02/13 2147)    New Prescriptions   CEPHALEXIN (KEFLEX) 500 MG CAPSULE    Take 1 capsule (500 mg total) by mouth 4 (four) times daily.        Vida Roller, MD 06/02/13 [phone]

## 2013-06-02 NOTE — ED Notes (Signed)
Per ems-- pt from [facility]. Staffed called ems for hypotension. ems bp 90/50 hr 66 97% RA. Hx of hyperglycemia (cbg 103). Pt is A&O at baseline (hx dementia). Pt also with fever 99.3

## 2013-06-04 LAB — URINE CULTURE: Colony Count: >=100000

## 2013-06-06 NOTE — ED Notes (Signed)
Post ED Visit - Positive Culture Follow-up  Culture report reviewed by antimicrobial stewardship pharmacist: []  Wes Dulaney, Pharm.D., BCPS []  Celedonio Miyamoto, Pharm.D., BCPS []  Georgina Pillion, [address].D., BCPS []  [place], [address].D., BCPS, AAHIVP []  Estella Husk, Pharm.D., BCPS, AAHIVP  Positive urine culture Treated with Cephalexin organism sensitive to the same and no further patient follow-up is required at this time.  Larena Sox 06/06/2013, 11:57 AM

## 2013-06-07 ENCOUNTER — Telehealth: Payer: Self-pay | Admitting: Internal Medicine

## 2013-06-07 MED ORDER — CIPROFLOXACIN HCL 500 MG PO TABS: 500.0000 mg | ORAL_TABLET | Freq: Two times a day (BID) | ORAL | Status: DC

## 2013-06-07 NOTE — Telephone Encounter (Signed)
Was seen at the ER with hypotension and a UTI. Urine culture showed Escherichia coli,  On keflex Plan:  Switch to Cipro, prescription sent. ER if fever or mental status changes, he has already an appointment to see me next week. Plan discuss with Carney Bern, his significant other.

## 2013-06-10 ENCOUNTER — Ambulatory Visit (INDEPENDENT_AMBULATORY_CARE_PROVIDER_SITE_OTHER): Payer: MEDICARE | Admitting: Internal Medicine

## 2013-06-10 VITALS — BP 126/74 | HR 67 | Temp 98.6°F

## 2013-06-10 DIAGNOSIS — D649 Anemia, unspecified: Secondary | ICD-10-CM

## 2013-06-10 DIAGNOSIS — E785 Hyperlipidemia, unspecified: Secondary | ICD-10-CM

## 2013-06-10 DIAGNOSIS — R269 Unspecified abnormalities of gait and mobility: Secondary | ICD-10-CM

## 2013-06-10 DIAGNOSIS — I359 Nonrheumatic aortic valve disorder, unspecified: Secondary | ICD-10-CM

## 2013-06-10 DIAGNOSIS — S32019S Unspecified fracture of first lumbar vertebra, sequela: Secondary | ICD-10-CM

## 2013-06-10 DIAGNOSIS — N390 Urinary tract infection, site not specified: Secondary | ICD-10-CM

## 2013-06-10 DIAGNOSIS — R55 Syncope and collapse: Secondary | ICD-10-CM

## 2013-06-10 DIAGNOSIS — I8310 Varicose veins of unspecified lower extremity with inflammation: Secondary | ICD-10-CM

## 2013-06-10 DIAGNOSIS — IMO0002 Reserved for concepts with insufficient information to code with codable children: Secondary | ICD-10-CM

## 2013-06-10 DIAGNOSIS — F0390 Unspecified dementia without behavioral disturbance: Secondary | ICD-10-CM

## 2013-06-10 NOTE — Progress Notes (Signed)
  Subjective:    Patient ID: Dale Wong, male    DOB: 10/21/1927, 79 y.o.   MRN: [id number]  HPI Last office visit 08-2012. Since the last time I saw him, several things have happened, I did an extensive chart review including admission to the hospital for syncope 11/2012. See assessment and plan.  Past Medical History  Diagnosis Date  . Hyperlipidemia   . Fatigue   . Parkinson disease     Sees neurology in [place]  . Depression   . Dementia     sees neurology in [place]  . Alcohol abuse   . Colon cancer     Last colonoscopy November 2011,  . Colon cancer   . Hyperglycemia   . Fall    Past Surgical History  Procedure Laterality Date  . Spinal fusion      back surgery for spinal stenosis  . Shoulder arthroscopy  4/11    s/p  . Colectomy      Social History: Divorced, lives at the [facility], staff provides meds and food   (sign[place]) No children Never Smoked Alcohol use-- no Stopped driving ~ 0102   Review of Systems ROS is done with the help of the patient's significant other and her son. He is in and a nursing home, he is receiving PT OT. Did not take Cipro as recommended for a UTI (see recent phone call) Denies any fever or chills. Still has some problems swallowing.     Objective:   Physical Exam General -- alert, well-developed   Lungs -- normal respiratory effort, no intercostal retractions, no accessory muscle use, and normal breath sounds.   Heart--+ syst murmur .   Extremities-- no pretibial edema , has Stockings in place  Neurologic-- alert & oriented X3 and strength normal in all extremities. Psych--  Has dementia, no formal exam done today not anxious appearing and not depressed appearing.        Assessment & Plan:  Since the last time he was here, he was  seen by other doctors, some of these medicines have been changed, I spent >> one hour counseling the pt, his family about the number of issues visited today, the need or no need  oc continue w/ aggressive treatment and w/u and trying to figure all his recent medical history  and  reviewing his chart. See assessment and plan.

## 2013-06-10 NOTE — Assessment & Plan Note (Signed)
Severe stasis dermatitis due to severe venous insufficiency, saw vascular surgery 08-2012, considering laser venous ablation.

## 2013-06-10 NOTE — Assessment & Plan Note (Addendum)
Saw GI11/2013, they recommended that EGD, it showed a 2 mm superficial ulcers. Rx H2Blokers which he is not on at this point Last Hg low but stable, plan to rechek on RTC

## 2013-06-10 NOTE — Assessment & Plan Note (Signed)
Has not seen neuro in a while

## 2013-06-10 NOTE — Patient Instructions (Addendum)
Instructions: Take  medications as prescribed in the list below. Ciprofloxacin 500 mg one tablet twice a day for the next 10 days ---- Please consult  speech therapy; Dx difficulty swallowing.  Needs a bedside swallow test, if needed arrange a swallow study at the hospital, change diet as needed  --- Next visit 1 month

## 2013-06-10 NOTE — Assessment & Plan Note (Signed)
Was on pravachol, now on simvastatin

## 2013-06-10 NOTE — Assessment & Plan Note (Addendum)
Fracture [id number] at that time he had a syncope. Discuss on RTC

## 2013-06-10 NOTE — Assessment & Plan Note (Addendum)
admited to the hospital 11/2012 after a syncope and a lumbar compression fracture. Echocardiogram showed worsening aortic stenosis ("moderate stenosis")--- was rec to see cards, he didn't  Carotid ultrasound was negative. He was noted to have dysphagia, was order to have a swallow test, that never happened; still has trouble swallow Plan: Discussed cards referal vs monitoring vs ECHO , we agreed to recheck a ECHO 11-2013 Refer to ST: bedside test for swallowing, see instructions

## 2013-06-10 NOTE — Assessment & Plan Note (Addendum)
Dx w/a UTI recently, I rec to change from keflex to cipro but that never happened, doing ok Plan: cipro  Recheck a UCX on RTC 1 month

## 2013-06-10 NOTE — Assessment & Plan Note (Addendum)
Multiple ER visits with falls, currently getting PT OT

## 2013-06-10 NOTE — Assessment & Plan Note (Addendum)
Worse per last echocardiogram 11/2012, Was recommended to see cardiology, see syncope, plan is to redo ECHO 11-2013

## 2013-06-11 ENCOUNTER — Encounter: Payer: Self-pay | Admitting: Internal Medicine

## 2013-06-25 ENCOUNTER — Other Ambulatory Visit (HOSPITAL_COMMUNITY): Payer: Self-pay | Admitting: Internal Medicine

## 2013-06-25 DIAGNOSIS — R1310 Dysphagia, unspecified: Secondary | ICD-10-CM

## 2013-06-30 ENCOUNTER — Other Ambulatory Visit (HOSPITAL_COMMUNITY): Payer: Self-pay | Admitting: Internal Medicine

## 2013-06-30 DIAGNOSIS — R1310 Dysphagia, unspecified: Secondary | ICD-10-CM

## 2013-06-30 DIAGNOSIS — G20 Parkinson's disease: Secondary | ICD-10-CM

## 2013-07-01 ENCOUNTER — Telehealth: Payer: Self-pay | Admitting: Internal Medicine

## 2013-07-01 NOTE — Telephone Encounter (Signed)
Mick Joesphine Bare is calling on behalf of [name] (his mother) who has POA over the patient. He states that Dr. Quentin Mulling, the patient's Urologist, had a concern about his Risperdal medication that the patient is taking and says that it may interfere with his Parkinson's medication.  Dr. Quentin Mulling wants for Dr. [name] to be aware so that he may want to take him off of it and prescribe something different but similar. Caller states that the patient's nursing home faxed this information to our office but has not heard back.  Advised caller that we are unable to call him to discuss this information because we have no DPR signed appointing him to be a contact person for the patient. He asks that we call his mother back at [phone] (Mrs. Joesphine Bare) to discuss.

## 2013-07-02 NOTE — Telephone Encounter (Signed)
1. Please get OV notes from  Neurology Dr.Hayworth 2. Advise patient's son,I haven't been issuing  Rx for  risperdal (per chart review) ,  I'm not opposed to stop it.

## 2013-07-02 NOTE — Telephone Encounter (Signed)
Copy of OV left on ledge for review

## 2013-07-02 NOTE — Telephone Encounter (Signed)
Spoke to Dr Annia Friendly C. Windle Guard, MD @ [phone] OV notes to be faxed now. Left message to call office to have [name] return call.

## 2013-07-03 ENCOUNTER — Ambulatory Visit (HOSPITAL_COMMUNITY): Payer: MEDICARE

## 2013-07-03 ENCOUNTER — Other Ambulatory Visit (HOSPITAL_COMMUNITY): Payer: MEDICARE

## 2013-07-03 NOTE — Telephone Encounter (Signed)
Note from neurology reviewed, apparently Risperdal is worsening   Parkinson's symptoms, he suggested to discontinue such medication and if behavioral issues resurface we could try Seroquel. Advise family: Stop Risperdal, call if behavioral issues resurface.

## 2013-07-03 NOTE — Telephone Encounter (Signed)
Tried to call several time line is busy will try again in AM.

## 2013-07-08 ENCOUNTER — Ambulatory Visit (HOSPITAL_COMMUNITY)
Admission: RE | Admit: 2013-07-08 | Discharge: 2013-07-08 | Disposition: A | Discharge status: Home / Self Care | Payer: MEDICARE | Source: Ambulatory Visit | Attending: Internal Medicine | Admitting: Internal Medicine

## 2013-07-08 DIAGNOSIS — R1310 Dysphagia, unspecified: Secondary | ICD-10-CM

## 2013-07-08 DIAGNOSIS — R1319 Other dysphagia: Secondary | ICD-10-CM | POA: Insufficient documentation

## 2013-07-08 NOTE — Procedures (Signed)
Objective Swallowing Evaluation: Modified Barium Swallowing Study  Patient Details  Name: TORENCE PALMERI MRN: [id number] Date of Birth: 1927-11-11  Today's Date: 07/08/2013 Time: 1145-1208 SLP Time Calculation (min): 23 min  Past Medical History:  Past Medical History  Diagnosis Date  . Hyperlipidemia   . Fatigue   . Parkinson disease     Sees neurology in [place]  . Depression   . Dementia     sees neurology in [place]  . Alcohol abuse   . Colon cancer     Last colonoscopy November 2011,  . Colon cancer   . Hyperglycemia   . Fall    Past Surgical History:  Past Surgical History  Procedure Laterality Date  . Spinal fusion      back surgery for spinal stenosis  . Shoulder arthroscopy  4/11    s/p  . Colectomy     HPI:  79year-old male with history of colon cancer, Parkinson's disease and dementia referred for OPMBS secondary to c/o difficulty swallowing.  Pt was seen by SLP services in Dec of 2013 for bedside swallow eval - impression revealed likely primary esophageal dysphagia with recs for GI f/u.       Assessment / Plan / Recommendation Clinical Impression  Dysphagia Diagnosis: Suspected primary esophageal dysphagia Clinical impression: Pt presents with a suspected primary esophageal dysphagia.  Esophageal screening revealed retention of barium with all consistencies to level of cervical esophagus.   Pharyngeal swallow is marked by intermittent penetration of liquids into the larynx, not inconsistent with normal age-related changes in swallow physiology.  Recommend further esophageal evaluation; no SLP f/u recommended.    Treatment Recommendation  No treatment recommended at this time    Diet Recommendation Regular;Thin liquid   Medication Administration: Whole meds with liquid Supervision: Patient able to self feed Postural Changes and/or Swallow Maneuvers: Seated upright 90 degrees;Upright 30-60 min after meal    Other  Recommendations Recommended Consults: Consider GI  evaluation;Consider esophageal assessment Oral Care Recommendations: Oral care BID   Follow Up Recommendations  None    Frequency and Duration        Pertinent Vitals/Pain No pain    SLP Swallow Goals     General  Type of Study: Modified Barium Swallowing Study Reason for Referral: Objectively evaluate swallowing function Diet Prior to this Study: Regular;Thin liquids Temperature Spikes Noted: No Respiratory Status: Room air History of Recent Intubation: No Behavior/Cognition: Alert;Cooperative;Pleasant mood Oral Cavity - Dentition: Adequate natural dentition Oral Motor / Sensory Function: Within functional limits Self-Feeding Abilities: Able to feed self Patient Positioning: Upright in chair Baseline Vocal Quality: Clear Volitional Cough: Strong Volitional Swallow: Able to elicit Anatomy:  (presence of likely cervical osteophytes, not confirmed by MD) Pharyngeal Secretions: Not observed secondary MBS    Reason for Referral Objectively evaluate swallowing function   Oral Phase Oral Preparation/Oral Phase Oral Phase: WFL   Pharyngeal Phase Pharyngeal Phase Pharyngeal Phase: Impaired Pharyngeal - Thin Pharyngeal - Thin Cup: Premature spillage to pyriform sinuses;Penetration/Aspiration during swallow Penetration/Aspiration details (thin cup): Material enters airway, remains ABOVE vocal cords then ejected out Pharyngeal Phase - Comment Pharyngeal Comment: intermittent penetration with thins - pt effectively ejects from larynx  Cervical Esophageal Phase    GO    Cervical Esophageal Phase - retention of barium throughout esophageal column Cervical Esophageal Phase: Impaired Cervical Esophageal Phase - Nectar Nectar Cup: Esophageal backflow into cervical esophagus Cervical Esophageal Phase - Thin Thin Cup: Esophageal backflow into cervical esophagus Cervical Esophageal Phase - Solids Puree:  Esophageal backflow into cervical esophagus    Functional Assessment  Tool Used: clinical judgement Functional Limitations: Swallowing Swallow Current Status (Z6109): At least 1 percent but less than 20 percent impaired, limited or restricted Swallow Goal Status [phone]): At least 1 percent but less than 20 percent impaired, limited or restricted Swallow Discharge Status [phone]): At least 1 percent but less than 20 percent impaired, limited or restricted    Blenda Mounts Laurice 07/08/2013, 12:21 PM

## 2013-07-10 NOTE — Telephone Encounter (Signed)
Left message to call office

## 2013-07-10 NOTE — Telephone Encounter (Signed)
Discuss with [name] Per her order will need to be faxed over to [facility]. Order were faxed this AM for D/C and to start seroquel 25 mg qhs.

## 2013-07-15 ENCOUNTER — Ambulatory Visit (INDEPENDENT_AMBULATORY_CARE_PROVIDER_SITE_OTHER): Payer: MEDICARE | Admitting: Internal Medicine

## 2013-07-15 VITALS — BP 100/60 | HR 72 | Temp 98.2°F

## 2013-07-15 DIAGNOSIS — N390 Urinary tract infection, site not specified: Secondary | ICD-10-CM

## 2013-07-15 DIAGNOSIS — R55 Syncope and collapse: Secondary | ICD-10-CM

## 2013-07-15 DIAGNOSIS — S32019S Unspecified fracture of first lumbar vertebra, sequela: Secondary | ICD-10-CM

## 2013-07-15 DIAGNOSIS — D649 Anemia, unspecified: Secondary | ICD-10-CM

## 2013-07-15 DIAGNOSIS — IMO0002 Reserved for concepts with insufficient information to code with codable children: Secondary | ICD-10-CM

## 2013-07-15 DIAGNOSIS — F0390 Unspecified dementia without behavioral disturbance: Secondary | ICD-10-CM

## 2013-07-15 DIAGNOSIS — R269 Unspecified abnormalities of gait and mobility: Secondary | ICD-10-CM

## 2013-07-15 DIAGNOSIS — R609 Edema, unspecified: Secondary | ICD-10-CM

## 2013-07-15 DIAGNOSIS — R1310 Dysphagia, unspecified: Secondary | ICD-10-CM | POA: Insufficient documentation

## 2013-07-15 MED ORDER — FUROSEMIDE 20 MG PO TABS: 20.0000 mg | ORAL_TABLET | Freq: Every day | ORAL | Status: DC

## 2013-07-15 NOTE — Assessment & Plan Note (Signed)
Plan was to check a CBC today however he was admitted to the hospital recently, will get records from [facility]

## 2013-07-15 NOTE — Assessment & Plan Note (Signed)
See physical exam above . Continue present care

## 2013-07-15 NOTE — Progress Notes (Signed)
  Subjective:    Patient ID: Dale Wong, male    DOB: [birth year], 79 y.o.   MRN: [id number]  HPI Followup from previous visit, here with his significan other Dale Wong and her son Dale Wong. Dysphagia, status post swallow study, no recommendations were made. History of L1 fracture, patient denies pain. History frequent falls, s/p  PT/OT, no further falls. History of UTI, was prescribed Cipro. As far as medication compliance, the family relies completely on the [facility] staff to provide his medications.  Of notice, the patient sees a doctor at the [place] from time to time, recently  he went there, his blood pressure was very low, he was admitted for one day at [facility] in [place]  and eventually diagnosed with dehydration. No records.  Past Medical History  Diagnosis Date  . Hyperlipidemia   . Fatigue   . Parkinson disease     Sees neurology in [place]  . Depression   . Dementia     sees neurology in [place]  . Alcohol abuse   . Colon cancer     Last colonoscopy Wong 2011,  . Colon cancer   . Hyperglycemia   . Fall    Past Surgical History  Procedure Laterality Date  . Spinal fusion      back surgery for spinal stenosis  . Shoulder arthroscopy  4/11    s/p  . Colectomy     History   Social History  . Marital Status: Married    Spouse Name: N/A    Number of Children: 0  . Years of Education: N/A   Occupational History  . Retired    Social History Main Topics  . Smoking status: Never Smoker   . Smokeless tobacco: Never Used  . Alcohol Use: No  . Drug Use: No  . Sexually Active: No   Other Topics Concern  . Not on file   Social History Narrative    Divorced, lives at the [facility], staff provides meds and food      (significan otherJean Spring Wong, Dale's son Dale Wong also involved in his care)       Stopped driving ~ 0865     Review of Systems I asked about fever, chills, nausea, vomiting, appetite. The family members present have not been  told of any issues by the staff at his assisted living    Objective:   Physical Exam BP 100/60  Pulse 72  Temp(Src) 98.2 F (36.8 C) (Oral)  SpO2 98%  General -- alert, well-developed, NAD Lungs -- normal respiratory effort, no intercostal retractions, no accessory muscle use, and normal breath sounds.   Heart-- + Systolic murmur  Extremities-- +/+++pretibial edema bilaterally  Neurologic-- She seems more alert today than on previous visits. He is oriented to self, knew his day of birth.  Does not know where he lives but knows he is in [facility]. He recalls what he had for breakfast. Dale Wong Not oriented to time.Marland Kitchen Psych--   not anxious appearing and not depressed appearing.       Assessment & Plan:  Today , I spent more than 25 min with the patient, >50% of the time counseling, mostly answering questions about the patient's care

## 2013-07-15 NOTE — Assessment & Plan Note (Signed)
Reportedly took Cipro, afebrile.

## 2013-07-15 NOTE — Assessment & Plan Note (Signed)
No further episodes

## 2013-07-15 NOTE — Patient Instructions (Addendum)
Will decrease Lasix from 40 mg to 20 mg daily. Watch for fluid accumulation. Check his  BP daily, goal  between 110/60 and 140/85. If it is consistently higher or lower, let me know. Please get me the records from [facility]. Next visit in 3 months

## 2013-07-15 NOTE — Assessment & Plan Note (Addendum)
On Lasix for edema, recently admitted to an outside hospital with low BP Plan: Decrease Lasix from 40 mg to 20 mg and monitor his BP and edema

## 2013-07-15 NOTE — Assessment & Plan Note (Signed)
Had a swallowing study done recently, no  new suggestions recommended.the family reports however that they are now recommending an additional x-ray. Mrs Dale Wong will call SP and see why the XR is recommended

## 2013-07-15 NOTE — Assessment & Plan Note (Signed)
Denies pain at this time.

## 2013-07-15 NOTE — Assessment & Plan Note (Signed)
Status post PT/OT, no further falls

## 2013-07-16 ENCOUNTER — Telehealth: Payer: Self-pay

## 2013-07-16 ENCOUNTER — Encounter: Payer: Self-pay | Admitting: Internal Medicine

## 2013-07-16 NOTE — Telephone Encounter (Signed)
Checked medication by new med list. GF/RN

## 2013-07-22 ENCOUNTER — Telehealth: Payer: Self-pay | Admitting: Internal Medicine

## 2013-07-22 NOTE — Telephone Encounter (Signed)
thx (see last OV)

## 2013-07-22 NOTE — Telephone Encounter (Signed)
FYI

## 2013-07-22 NOTE — Telephone Encounter (Signed)
Dale Wong, patient's caregiver, is calling to let Dr. [name] know that the X-ray of the patient's throat was a mistake and has been cancelled. No call back requested.

## 2013-07-23 ENCOUNTER — Emergency Department (HOSPITAL_BASED_OUTPATIENT_CLINIC_OR_DEPARTMENT_OTHER): Payer: MEDICARE

## 2013-07-23 ENCOUNTER — Encounter (HOSPITAL_BASED_OUTPATIENT_CLINIC_OR_DEPARTMENT_OTHER): Payer: Self-pay | Admitting: Emergency Medicine

## 2013-07-23 ENCOUNTER — Emergency Department (HOSPITAL_BASED_OUTPATIENT_CLINIC_OR_DEPARTMENT_OTHER)
Admission: EM | Admit: 2013-07-23 | Discharge: 2013-07-23 | Disposition: A | Discharge status: Home / Self Care | Payer: MEDICARE | Attending: Emergency Medicine | Admitting: Emergency Medicine

## 2013-07-23 ENCOUNTER — Ambulatory Visit (INDEPENDENT_AMBULATORY_CARE_PROVIDER_SITE_OTHER): Payer: MEDICARE | Admitting: Internal Medicine

## 2013-07-23 VITALS — BP 75/42 | HR 67 | Temp 97.5°F

## 2013-07-23 DIAGNOSIS — Z9181 History of falling: Secondary | ICD-10-CM | POA: Insufficient documentation

## 2013-07-23 DIAGNOSIS — Z7982 Long term (current) use of aspirin: Secondary | ICD-10-CM | POA: Insufficient documentation

## 2013-07-23 DIAGNOSIS — G219 Secondary parkinsonism, unspecified: Secondary | ICD-10-CM | POA: Insufficient documentation

## 2013-07-23 DIAGNOSIS — R350 Frequency of micturition: Secondary | ICD-10-CM | POA: Insufficient documentation

## 2013-07-23 DIAGNOSIS — Z862 Personal history of diseases of the blood and blood-forming organs and certain disorders involving the immune mechanism: Secondary | ICD-10-CM | POA: Insufficient documentation

## 2013-07-23 DIAGNOSIS — G903 Multi-system degeneration of the autonomic nervous system: Secondary | ICD-10-CM

## 2013-07-23 DIAGNOSIS — F0390 Unspecified dementia without behavioral disturbance: Secondary | ICD-10-CM | POA: Insufficient documentation

## 2013-07-23 DIAGNOSIS — Z85038 Personal history of other malignant neoplasm of large intestine: Secondary | ICD-10-CM | POA: Insufficient documentation

## 2013-07-23 DIAGNOSIS — F329 Major depressive disorder, single episode, unspecified: Secondary | ICD-10-CM | POA: Insufficient documentation

## 2013-07-23 DIAGNOSIS — Y939 Activity, unspecified: Secondary | ICD-10-CM | POA: Insufficient documentation

## 2013-07-23 DIAGNOSIS — Z981 Arthrodesis status: Secondary | ICD-10-CM | POA: Insufficient documentation

## 2013-07-23 DIAGNOSIS — I959 Hypotension, unspecified: Secondary | ICD-10-CM

## 2013-07-23 DIAGNOSIS — I951 Orthostatic hypotension: Secondary | ICD-10-CM | POA: Insufficient documentation

## 2013-07-23 DIAGNOSIS — R011 Cardiac murmur, unspecified: Secondary | ICD-10-CM | POA: Insufficient documentation

## 2013-07-23 DIAGNOSIS — Z8639 Personal history of other endocrine, nutritional and metabolic disease: Secondary | ICD-10-CM | POA: Insufficient documentation

## 2013-07-23 DIAGNOSIS — Y929 Unspecified place or not applicable: Secondary | ICD-10-CM | POA: Insufficient documentation

## 2013-07-23 DIAGNOSIS — S20229A Contusion of unspecified back wall of thorax, initial encounter: Secondary | ICD-10-CM | POA: Insufficient documentation

## 2013-07-23 DIAGNOSIS — Z79899 Other long term (current) drug therapy: Secondary | ICD-10-CM | POA: Insufficient documentation

## 2013-07-23 DIAGNOSIS — F3289 Other specified depressive episodes: Secondary | ICD-10-CM | POA: Insufficient documentation

## 2013-07-23 DIAGNOSIS — E785 Hyperlipidemia, unspecified: Secondary | ICD-10-CM | POA: Insufficient documentation

## 2013-07-23 DIAGNOSIS — X58XXXA Exposure to other specified factors, initial encounter: Secondary | ICD-10-CM | POA: Insufficient documentation

## 2013-07-23 LAB — CBC WITH DIFFERENTIAL/PLATELET
Basophils Absolute: 0 10*3/uL (ref 0.0–0.1)
Basophils Relative: 0 % (ref 0–1)
Eosinophils Absolute: 0.1 10*3/uL (ref 0.0–0.7)
Eosinophils Relative: 1 % (ref 0–5)
MCH: 29.3 pg (ref 26.0–34.0)
MCHC: 32.8 g/dL (ref 30.0–36.0)
MCV: 89.3 fL (ref 78.0–100.0)
Neutrophils Relative %: 67 % (ref 43–77)
Platelets: 131 10*3/uL — ABNORMAL LOW (ref 150–400)
RDW: 16.1 % — ABNORMAL HIGH (ref 11.5–15.5)

## 2013-07-23 LAB — URINALYSIS, ROUTINE W REFLEX MICROSCOPIC
Bilirubin Urine: NEGATIVE
Glucose, UA: NEGATIVE mg/dL
Nitrite: NEGATIVE
Specific Gravity, Urine: 1.013 (ref 1.005–1.030)
pH: 7 (ref 5.0–8.0)

## 2013-07-23 LAB — COMPREHENSIVE METABOLIC PANEL
ALT: 6 U/L (ref 0–53)
Albumin: 3.8 g/dL (ref 3.5–5.2)
Calcium: 9.8 mg/dL (ref 8.4–10.5)
GFR calc Af Amer: >90 mL/min (ref >90)
Glucose, Bld: 90 mg/dL (ref 70–99)
Sodium: 139 mEq/L (ref 135–145)
Total Protein: 6.5 g/dL (ref 6.0–8.3)

## 2013-07-23 LAB — URINE MICROSCOPIC-ADD ON

## 2013-07-23 MED ORDER — SODIUM CHLORIDE 0.9 % IV BOLUS (SEPSIS)
500.0000 mL | Freq: Once | INTRAVENOUS | Status: AC
  Administered 2013-07-23: 500 mL via INTRAVENOUS

## 2013-07-23 NOTE — Patient Instructions (Addendum)
Please go to  [facility] [place],  [address] (10 minutes form here)  [address]  [place], [place] [address] [phone]  

## 2013-07-23 NOTE — ED Provider Notes (Addendum)
CSN: [id number]     Arrival date & time 07/23/13  1503 History     First MD Initiated Contact with Patient 07/23/13 1510     Chief Complaint  Patient presents with  . Hypotension   (Consider location/radiation/quality/duration/timing/severity/associated sxs/prior Treatment) HPI Comments: Patient presented PCP office due to hypotension. Patient went to the office for evaluation of tendon bruise to his lower back area. Patient cannot recall any trauma and significant others' states that if he would have fallen nursing facility would have reported it. However given patient's radiation a.m. mild dementia unclear if he didn't actually fall. He complains of mild pain over his lower back. Upon arrival to the office patient's blood pressure was 75/42. Patient was asymptomatic with this and at his baseline mental status. Patient denies any fever, cough, shortness of breath, abdominal pain, appetite changes, chest pain, vomiting or diarrhea. He states he urinates constantly due to the Lasix but no dysuria.  Dr. Lowell Guitar suggested DC'd Lasix and sent here for further evaluation and possible IV fluids.  The history is provided by the patient and the spouse.    Past Medical History  Diagnosis Date  . Hyperlipidemia   . Fatigue   . Parkinson disease     Sees neurology in [place]  . Depression   . Dementia     sees neurology in [place]  . Alcohol abuse   . Colon cancer     Last colonoscopy November 2011,  . Colon cancer   . Hyperglycemia   . Fall    Past Surgical History  Procedure Laterality Date  . Spinal fusion      back surgery for spinal stenosis  . Shoulder arthroscopy  4/11    s/p  . Colectomy     Family History  Problem Relation Age of Onset  . Liver cancer Brother   . Diabetes Neg Hx   . Coronary artery disease Neg Hx    History  Substance Use Topics  . Smoking status: Never Smoker   . Smokeless tobacco: Never Used  . Alcohol Use: No    Review of Systems  Constitutional: Negative  for fever, chills and fatigue.  Respiratory: Negative for cough and shortness of breath.   Cardiovascular: Negative for chest pain and leg swelling.  Gastrointestinal: Negative for nausea, vomiting, abdominal pain, diarrhea and constipation.  Genitourinary: Positive for frequency. Negative for dysuria.  All other systems reviewed and are negative.    Allergies  Review of patient's allergies indicates no known allergies.  Home Medications   Current Outpatient Rx  Name  Route  Sig  Dispense  Refill  . acetaminophen (TYLENOL) 500 MG tablet   Oral   Take 1,000 mg by mouth every 8 (eight) hours as needed for pain.         Marland Kitchen aspirin EC 81 MG tablet   Oral   Take 81 mg by mouth at bedtime.          . carbidopa-levodopa (PARCOPA) 25-250 MG per disintegrating tablet   Oral   Take 1 tablet by mouth 4 (four) times daily.         . citalopram (CELEXA) 20 MG tablet   Oral   Take 20 mg by mouth daily.         . cyanocobalamin 500 MCG tablet   Oral   Take 500 mcg by mouth every morning.         . entacapone (COMTAN) 200 MG tablet   Oral   Take  200 mg by mouth 4 (four) times daily.          . fish oil-omega-3 fatty acids 1000 MG capsule   Oral   Take 1 g by mouth daily.          . furosemide (LASIX) 20 MG tablet   Oral   Take 1 tablet (20 mg total) by mouth daily.   30 tablet   6   . LORazepam (ATIVAN) 1 MG tablet   Oral   Take 1 mg by mouth at bedtime.         . memantine (NAMENDA) 10 MG tablet   Oral   Take 10 mg by mouth 2 (two) times daily.         . Multiple Vitamins-Minerals (CENTRUM SILVER PO)   Oral   Take 1 tablet by mouth daily.         . QUEtiapine (SEROQUEL) 25 MG tablet   Oral   Take 25 mg by mouth at bedtime.         . Rivastigmine (EXELON) 13.3 MG/24HR PT24   Transdermal   Place 13.3 mg onto the skin daily.         Marland Kitchen rOPINIRole (REQUIP) 3 MG tablet   Oral   Take 3 mg by mouth 3 (three) times daily.          .  simvastatin (ZOCOR) 20 MG tablet   Oral   Take 20 mg by mouth at bedtime.         . triamcinolone cream (KENALOG) 0.1 %   Topical   Apply 1 application topically 2 (two) times daily as needed (itching/rash).         . vitamin C (ASCORBIC ACID) 500 MG tablet   Oral   Take 500 mg by mouth daily.           Marland Kitchen zolpidem (AMBIEN) 10 MG tablet   Oral   Take 10 mg by mouth at bedtime.          BP 128/53  Pulse 62  Temp(Src) 98 F (36.7 C) (Oral)  Resp 18  SpO2 100% Physical Exam  Nursing note and vitals reviewed. Constitutional: He is oriented to person, place, and time. He appears well-developed and well-nourished. No distress.  HENT:  Head: Normocephalic and atraumatic.  Mouth/Throat: Oropharynx is clear and moist and mucous membranes are normal.  Eyes: Conjunctivae and EOM are normal. Pupils are equal, round, and reactive to light.  Neck: Normal range of motion. Neck supple.  Cardiovascular: Normal rate, regular rhythm and intact distal pulses.   Murmur heard.  Systolic murmur is present with a grade of 3/6  Pulmonary/Chest: Effort normal and breath sounds normal. No respiratory distress. He has no wheezes. He has no rales.  Abdominal: Soft. He exhibits no distension. There is no tenderness. There is no rebound and no guarding.  Musculoskeletal: Normal range of motion. He exhibits edema and tenderness.       Right hip: Normal.       Left hip: Normal.       Back:  Trace edema of the lower ext  Neurological: He is alert and oriented to person, place, and time.  Skin: Skin is warm and dry. No rash noted. No erythema.  Ecchymosis that are healing over bilateral hands  Psychiatric: He has a normal mood and affect. His behavior is normal.    ED Course   Procedures (including critical care time)  Labs Reviewed  CBC WITH DIFFERENTIAL -  Abnormal; Notable for the following:    RBC 3.65 (*)    Hemoglobin 10.7 (*)    HCT 32.6 (*)    RDW 16.1 (*)    Platelets 131 (*)      All other components within normal limits  COMPREHENSIVE METABOLIC PANEL - Abnormal; Notable for the following:    AST 38 (*)    GFR calc non Af Amer 84 (*)    All other components within normal limits  URINALYSIS, ROUTINE W REFLEX MICROSCOPIC - Abnormal; Notable for the following:    Color, Urine AMBER (*)    Leukocytes, UA TRACE (*)    All other components within normal limits  TROPONIN I  URINE MICROSCOPIC-ADD ON   Dg Chest Port 1 View  07/23/2013   *RADIOLOGY REPORT*  Clinical Data: Hypotension.  PORTABLE CHEST - 1 VIEW  Comparison: June 02, 2013.  Findings: Cardiomediastinal silhouette appears normal.  No acute pulmonary disease is noted.  Bony thorax is intact.  IMPRESSION: No acute cardiopulmonary abnormality seen.   Original Report Authenticated By: Lupita Raider.,  M.D.     Date: 07/23/2013  Rate: 65  Rhythm: normal sinus rhythm and premature ventricular contractions (PVC)  QRS Axis: normal  Intervals: normal  ST/T Wave abnormalities: normal  Conduction Disutrbances:none  Narrative Interpretation:   Old EKG Reviewed: unchanged   1. Parkinsons disease, secondary   2. Neurologic orthostatic hypotension     MDM   Pt presents from PCP office due to hypotension in the office of 75/42.  Pt was being seen there due to hematoma with unknown trauma.  Pt denies any infectious sx and no CP, SOB or abd pain.  Systolic murmur that is not new and well perfused on exam.  BP here 128/53 and normal pulse.  Pt does have large hematoma on the back and ecchymosis but otherwise no other trauma.  Will check for infectious reason for hypotension vs anemia from hematoma and ecchymosis which I doubt vs hypotension related to position changes due to parkinson's disease and medication vs dehydration from being on lasix and not drinking much fluid.  Dr. [name] has already suggested d/c lasix but will check CBC, CMP, UA, troponin, EKG and CXR to further eval.  4:34 PM All labs and x-ray wnl.   Orthostatic were positive for drop withposition change.  Will d/c lasix and have pt f/u with Dr. [name] about other medication adjustments.  Gwyneth Sprout, MD 07/23/13 1636  Gwyneth Sprout, MD 07/23/13 4098

## 2013-07-23 NOTE — ED Notes (Signed)
Sent here from Dr Perfecto [facility] due to hypotension. Report from POA pt c/o pain to lower back yesterday. POA noted large bruise to area. Pt was taken to PCP today for same. Large bruise to lower back noted. Staff at [facility] memory care unit unaware of any falls. Pt states he does not remember falling. Pt wheelchair bound, was assisted from wheelchair to stretcher by staff x3.

## 2013-07-23 NOTE — Progress Notes (Signed)
  Subjective:    Patient ID: Dale Wong, male    DOB: April 22, 1927, 79 y.o.   MRN: [id number]  HPI Acute visit,here with his significan other Mrs [name] and her son Kathlene November. The patient  is here because he was found to have a hematoma in the back. Mrs. Joesphine Bare and the patient do not known or recall  any recent falls. Minimal pain if any. At the office, his BP was noted to be low. The patient in general is not feeling well, feeling weak, unable to tell me if orthostatics because he usually does not stand up; there is no reports of fever, chills, nausea, vomiting or diarrhea. When asked about his mental status, he is a little confused today but he does have those type of days now and then. Past Medical History  Diagnosis Date  . Hyperlipidemia   . Fatigue   . Parkinson disease     Sees neurology in [place]  . Depression   . Dementia     sees neurology in [place]  . Alcohol abuse   . Colon cancer     Last colonoscopy November 2011,  . Colon cancer   . Hyperglycemia   . Fall    Past Surgical History  Procedure Laterality Date  . Spinal fusion      back surgery for spinal stenosis  . Shoulder arthroscopy  4/11    s/p  . Colectomy       Review of Systems I talk w/ the med tech at the [facility]: Med list updated per med list from them. BP yesterday 112/70, BP today 126/78. Bruise noted  yesterday, no reported falls. Current dose of Lasix is 20 mg. They have not noted any fever, chills, nausea, vomiting.     Objective:   Physical Exam  Skin:      BP 75/42  Pulse 67  Temp(Src) 97.5 F (36.4 C) (Oral)  SpO2 97% General -- alert, well-developed, No distress, wheelchair bound.    Lungs -- normal respiratory effort, no intercostal retractions, no accessory muscle use, and normal breath sounds.   Heart-- Not tachycardiac, + systolic murmur   Abdomen--soft, non-tender, no distention  Extremities-- no pretibial edema bilaterally Rectal--  Brown stool, Hemoccult  negative Neurologic-- No formal exam today but he is alert, follows simple commands, no obvious motor deficits, some involuntary movements noted Psych--  not anxious appearing and not depressed appearing.       Assessment & Plan:  Hypotension, bruise  Mental status is not far from baseline, he is afebrile. He has a big bruise but is not on Coumadin so I don't suspect internal bleeding, hemoccult negative. I'm still concern about the low BP Plan: ER for further eval  He likely needs CBC UA BMP IVF to be sure he is ok XRs Will also probably need to d/c lasix permanently BP may also be low d/t some of the parkinson meds , consider adjust meds (although dose was increased in June) Asked Mrs Joesphine Bare to call me tomorrow and let me know how he is doing  Also, we had a long conversation about his care. Apparently the [facility] is taking a number of steps to prevent falls but evidently is not working. Is becoming evident that he needs the next step in care such a  nursing home   Today , I spent more than 45 min with the patient, >50% of the time counseling,  reviewing med list

## 2013-07-23 NOTE — ED Notes (Signed)
MD at bedside. 

## 2013-07-24 ENCOUNTER — Telehealth: Payer: Self-pay | Admitting: Internal Medicine

## 2013-07-24 ENCOUNTER — Encounter: Payer: Self-pay | Admitting: Internal Medicine

## 2013-07-24 NOTE — Telephone Encounter (Signed)
ER note reviewed: BP was normal there. EKG no changes, chest x-ray no acute findings. UA with no evidence of infection, CMP essentially normal, troponin negative x1, hemoglobin slightly decreased to 10.7. BP was noted to drop orthostatically. I also talked to Dr. Quentin Mulling, the patient's neurologist today regards hypotension, he agrees that low BP could be from Parkinson itself or medication related. I asked if we could cut back on the parkinson medications, he rather suggests not to as his symptoms can get  Worse, suggested we try we could try Florinef if needed.    Judeth Cornfield, please call Mrs. Greeson: Plan is to stay away from Lasix, continue with all the same medications , watch for lower extremity edema. BP needs to be checked daily, call with  BP readings in 10 days. Will consider Florinef

## 2013-07-24 NOTE — Telephone Encounter (Signed)
lmovm to return call  °

## 2013-07-24 NOTE — Telephone Encounter (Signed)
Patient's caregiver, [name] is calling for Dr. [name]. States that at the patient's appointment yesterday Dr. [name] asked her to call him today. She does not know what about but she does want to let him know that the nursing home took his blood pressure this morning and it was 129 over 64.

## 2013-07-24 NOTE — Telephone Encounter (Signed)
Please advise if needed.

## 2013-07-25 NOTE — Telephone Encounter (Signed)
Discussed with Mrs. Joesphine Bare. Verbalized understanding. Spoke with [facility], med [facility], as well and updated them on plan. Orders faxed to [facility] per request of staff.

## 2013-08-11 ENCOUNTER — Ambulatory Visit (INDEPENDENT_AMBULATORY_CARE_PROVIDER_SITE_OTHER): Payer: MEDICARE | Admitting: Internal Medicine

## 2013-08-11 ENCOUNTER — Other Ambulatory Visit: Payer: Self-pay | Admitting: Internal Medicine

## 2013-08-11 ENCOUNTER — Encounter: Payer: Self-pay | Admitting: Internal Medicine

## 2013-08-11 VITALS — BP 105/70 | HR 68 | Temp 97.7°F | Wt 186.0 lb

## 2013-08-11 DIAGNOSIS — R609 Edema, unspecified: Secondary | ICD-10-CM

## 2013-08-11 DIAGNOSIS — I959 Hypotension, unspecified: Secondary | ICD-10-CM

## 2013-08-11 DIAGNOSIS — R21 Rash and other nonspecific skin eruption: Secondary | ICD-10-CM

## 2013-08-11 MED ORDER — CEPHALEXIN 500 MG PO CAPS: 500.0000 mg | ORAL_CAPSULE | Freq: Four times a day (QID) | ORAL | Status: DC

## 2013-08-11 MED ORDER — NYSTATIN-TRIAMCINOLONE 100000-0.1 UNIT/GM-% EX CREA: TOPICAL_CREAM | Freq: Four times a day (QID) | CUTANEOUS | Status: DC

## 2013-08-11 NOTE — Patient Instructions (Addendum)
Apply  nystatin twice a day the right elbow x 10 days Take Keflex for one week as prescribed. Call if redness is getting worse or if you have fever or chills.  Also please share this medication list with your caregivers, you need to be taking the medications as prescribed.  Next visit in 3 months  Check the  blood pressure 2 or 3 times a week, be sure it is between 110/60 and 140/85. If it is consistently higher or lower, let me know

## 2013-08-11 NOTE — Progress Notes (Signed)
Subjective:    Patient ID: Dale Wong, male    DOB: 05-23-1927, 79 y.o.   MRN: [id number]  HPI Acute visit here with his significan other Dale Wong and her son Dale Wong.  Noted that rash around the right elbow 4 days ago, areas itching. Also noted a scoriation yesterday. Wife is also concerned about his blood pressure. He went to see the neurologist few days ago, he was found to be doing well from a Parkinson's standpoint.  Past Medical History  Diagnosis Date  . Hyperlipidemia   . Fatigue   . Parkinson disease     Sees neurology in [place]  . Depression   . Dementia     sees neurology in [place]  . Alcohol abuse   . Colon cancer     Last colonoscopy Wong 2011,  . Colon cancer   . Hyperglycemia   . Fall    Past Surgical History  Procedure Laterality Date  . Spinal fusion      back surgery for spinal stenosis  . Shoulder arthroscopy  4/11    s/p  . Colectomy     History   Social History  . Marital Status: Married    Spouse Name: N/A    Number of Children: 0  . Years of Education: N/A   Occupational History  . Retired    Social History Main Topics  . Smoking status: Never Smoker   . Smokeless tobacco: Never Used  . Alcohol Use: No  . Drug Use: No  . Sexual Activity: No   Other Topics Concern  . Not on file   Social History Narrative    Divorced, lives at the [facility], staff provides meds and food      (significan otherJean Mount Wong, Dale's son Dale Wong also involved in his care)       Stopped driving ~ 4098     Review of Systems Has developed a hematoma at the left hand, patient denies pain. Ambulatory BPs around 140/70, 81 take by the wife. BP at the neurologist is 120/70. Wife is concerned because he is not receiving vitamin D.     Objective:   Physical Exam  Skin:       General -- alert, well-developed. Wheelchair bound.   Extremities--  Using compression stockings, +/+++ edema on the right, no edema on the left. Has a hematoma on the  dorsal aspect of the left hand,  denies pain with palpation. Range of motion of the left wrist is normal. Back--previously seen ecchymoses much improved, he still has a hematoma. No excoriation noted today. Neurologic-- alert ,Some involuntary facial movements noted.   Psych-- Cognition and judgment appear intact. Alert and cooperative with normal attention span and concentration. not anxious appearing and not depressed appearing.      Assessment & Plan:   Hypotension, Better after we discontinued Lasix. Has developed small amount of swelling of the right leg, for now recommend observation. Case was discussed with neurology, Low BP may be due to parkinson medication however he is getting good results from them consequently we could try Florinef at some point to treat the low BP and cont parkinson meds .  Edema, Lasix d/c due to low BP, observation for now  Rash, right elbow, From a fungal infection? Will prescribe Mycolog. May be developing a bacterial infection as well (swelling,warmness)---> Will prescribe Keflex.  Placement, Again, we discussed other  Options than the [facility]   Today , I spent more than 30  min with the patient, >50% of the time counseling, and answering multiple questions about her patient care.

## 2013-08-12 ENCOUNTER — Encounter: Payer: Self-pay | Admitting: Internal Medicine

## 2013-08-12 NOTE — Telephone Encounter (Signed)
Medication is prescribed by neurology, defer to them

## 2013-08-12 NOTE — Telephone Encounter (Signed)
Ok to refill seroquel? Last OV 08/11/13 Last filled by a historical provider, no date or quantity given.

## 2013-08-12 NOTE — Telephone Encounter (Signed)
Spoke with pharmacy made aware refill needs to be sent to Dr. Windle Guard with [facility].

## 2013-08-16 ENCOUNTER — Other Ambulatory Visit: Payer: Self-pay | Admitting: Internal Medicine

## 2013-08-18 ENCOUNTER — Other Ambulatory Visit: Payer: Self-pay | Admitting: *Deleted

## 2013-08-18 MED ORDER — SIMVASTATIN 20 MG PO TABS: 20.0000 mg | ORAL_TABLET | Freq: Every day | ORAL | Status: DC

## 2013-08-18 NOTE — Telephone Encounter (Signed)
Rx was refilled for zocor 20 mg.  Ag cma

## 2013-08-18 NOTE — Telephone Encounter (Signed)
Rx was refilled for Simvastatin 20mg .  Ag cma

## 2013-08-27 ENCOUNTER — Telehealth: Payer: Self-pay | Admitting: Internal Medicine

## 2013-08-27 ENCOUNTER — Telehealth: Payer: Self-pay | Admitting: *Deleted

## 2013-08-27 MED ORDER — LORAZEPAM 1 MG PO TABS: 1.0000 mg | ORAL_TABLET | Freq: Every day | ORAL | Status: DC

## 2013-08-27 NOTE — Telephone Encounter (Addendum)
Previously, all his psych medicines were prescribed by psych, to facilitate his care we'll go ahead and prescribed Ativan today.

## 2013-08-27 NOTE — Addendum Note (Signed)
Addended by: Willow Ora E on: 08/27/2013 05:08 PM   Modules accepted: Orders

## 2013-08-27 NOTE — Telephone Encounter (Signed)
rx refill- Ativan  Last OV- 08/11/13 Last refilled- 06/02/13 by historical provider.

## 2013-08-27 NOTE — Telephone Encounter (Signed)
Elease Hashimoto at the [facility] is calling and says that the patient who is their resident is out of his Ativan rx. The patient has a behavior problem and they are having a hard time without this prescription. I asked her if she was sure Dr. [name] had originally prescribed Ativan because I did not see where he had and she says that she called the patient's former doctor who told her that they were no longer providing care for the patient. Elease Hashimoto is asking that we fax Rx to their facility. Fax#: [phone]

## 2013-08-27 NOTE — Telephone Encounter (Signed)
rx refilled per protocol by Dr.Paz.

## 2013-09-02 ENCOUNTER — Other Ambulatory Visit: Payer: Self-pay | Admitting: Internal Medicine

## 2013-09-03 NOTE — Telephone Encounter (Signed)
rx refill request- Ambien  Last OV- 08/11/13 Future OV- 10/06/13 Last refilled by historical provider  No UDS on file.  Please advise. DJR

## 2013-09-05 ENCOUNTER — Other Ambulatory Visit: Payer: Self-pay | Admitting: *Deleted

## 2013-09-05 MED ORDER — ZOLPIDEM TARTRATE 10 MG PO TABS: 10.0000 mg | ORAL_TABLET | Freq: Every evening | ORAL | Status: DC | PRN

## 2013-09-29 ENCOUNTER — Emergency Department (HOSPITAL_COMMUNITY)
Admission: EM | Admit: 2013-09-29 | Discharge: 2013-09-30 | Disposition: A | Discharge status: Home / Self Care | Payer: MEDICARE | Attending: Emergency Medicine | Admitting: Emergency Medicine

## 2013-09-29 ENCOUNTER — Encounter (HOSPITAL_COMMUNITY): Payer: Self-pay | Admitting: Emergency Medicine

## 2013-09-29 ENCOUNTER — Emergency Department (HOSPITAL_COMMUNITY): Payer: MEDICARE

## 2013-09-29 DIAGNOSIS — E785 Hyperlipidemia, unspecified: Secondary | ICD-10-CM | POA: Insufficient documentation

## 2013-09-29 DIAGNOSIS — S51009A Unspecified open wound of unspecified elbow, initial encounter: Secondary | ICD-10-CM | POA: Insufficient documentation

## 2013-09-29 DIAGNOSIS — R296 Repeated falls: Secondary | ICD-10-CM | POA: Insufficient documentation

## 2013-09-29 DIAGNOSIS — Z79899 Other long term (current) drug therapy: Secondary | ICD-10-CM | POA: Insufficient documentation

## 2013-09-29 DIAGNOSIS — Z7982 Long term (current) use of aspirin: Secondary | ICD-10-CM | POA: Insufficient documentation

## 2013-09-29 DIAGNOSIS — Z9181 History of falling: Secondary | ICD-10-CM | POA: Insufficient documentation

## 2013-09-29 DIAGNOSIS — M25529 Pain in unspecified elbow: Secondary | ICD-10-CM | POA: Insufficient documentation

## 2013-09-29 DIAGNOSIS — R413 Other amnesia: Secondary | ICD-10-CM | POA: Insufficient documentation

## 2013-09-29 DIAGNOSIS — F3289 Other specified depressive episodes: Secondary | ICD-10-CM | POA: Insufficient documentation

## 2013-09-29 DIAGNOSIS — R259 Unspecified abnormal involuntary movements: Secondary | ICD-10-CM | POA: Insufficient documentation

## 2013-09-29 DIAGNOSIS — F329 Major depressive disorder, single episode, unspecified: Secondary | ICD-10-CM | POA: Insufficient documentation

## 2013-09-29 DIAGNOSIS — S0003XA Contusion of scalp, initial encounter: Secondary | ICD-10-CM | POA: Insufficient documentation

## 2013-09-29 DIAGNOSIS — G20A1 Parkinson's disease without dyskinesia, without mention of fluctuations: Secondary | ICD-10-CM | POA: Insufficient documentation

## 2013-09-29 DIAGNOSIS — W19XXXA Unspecified fall, initial encounter: Secondary | ICD-10-CM

## 2013-09-29 DIAGNOSIS — F1010 Alcohol abuse, uncomplicated: Secondary | ICD-10-CM | POA: Insufficient documentation

## 2013-09-29 DIAGNOSIS — S0083XA Contusion of other part of head, initial encounter: Secondary | ICD-10-CM

## 2013-09-29 DIAGNOSIS — Z85038 Personal history of other malignant neoplasm of large intestine: Secondary | ICD-10-CM | POA: Insufficient documentation

## 2013-09-29 DIAGNOSIS — G20 Parkinson's disease: Secondary | ICD-10-CM | POA: Insufficient documentation

## 2013-09-29 DIAGNOSIS — Y92009 Unspecified place in unspecified non-institutional (private) residence as the place of occurrence of the external cause: Secondary | ICD-10-CM | POA: Insufficient documentation

## 2013-09-29 DIAGNOSIS — F0390 Unspecified dementia without behavioral disturbance: Secondary | ICD-10-CM | POA: Insufficient documentation

## 2013-09-29 DIAGNOSIS — Y939 Activity, unspecified: Secondary | ICD-10-CM | POA: Insufficient documentation

## 2013-09-29 LAB — POCT I-STAT, CHEM 8
BUN: 26 mg/dL — ABNORMAL HIGH (ref 6–23)
Calcium, Ion: 1.24 mmol/L (ref 1.13–1.30)
Chloride: 102 mEq/L (ref 96–112)
HCT: 37 % — ABNORMAL LOW (ref 39.0–52.0)
Hemoglobin: 12.6 g/dL — ABNORMAL LOW (ref 13.0–17.0)
Sodium: 140 mEq/L (ref 135–145)

## 2013-09-29 LAB — URINALYSIS, ROUTINE W REFLEX MICROSCOPIC
Ketones, ur: 15 mg/dL — AB
Leukocytes, UA: NEGATIVE
Nitrite: NEGATIVE
Specific Gravity, Urine: 1.016 (ref 1.005–1.030)
pH: 6 (ref 5.0–8.0)

## 2013-09-29 LAB — CBC
HCT: 35.8 % — ABNORMAL LOW (ref 39.0–52.0)
MCH: 29.2 pg (ref 26.0–34.0)
Platelets: 128 10*3/uL — ABNORMAL LOW (ref 150–400)
RBC: 4.15 MIL/uL — ABNORMAL LOW (ref 4.22–5.81)
WBC: 6.8 10*3/uL (ref 4.0–10.5)

## 2013-09-29 NOTE — ED Notes (Signed)
Pt does not remember falling.

## 2013-09-29 NOTE — ED Notes (Signed)
Called [facility] to Transport pt. Back to facility 

## 2013-09-29 NOTE — ED Notes (Signed)
MD at bedside. MD applied dermabond to pt's wound on right side of face. Tegaderm applied to pt's right elbow.

## 2013-09-29 NOTE — ED Notes (Addendum)
Per EMS pt fell at home, pt use to stay at a nursing home for care of parkinson's dx and dementia, pt is A&Ox3. Pt is incontinent upon arrival to the department. Pt presents with a skin tear on his right elbow that was bleeding, but it was not upon arrival to the department. Pt also has an abrasion on the right side of his face.  Pt recently moved back home and has a caregiver to care for him, pt's caregiver unable to care for his needs like she thought she could and she believes he needs to placed in a nursing facility again. Pt was found on the floor, caregiver believes he fell out of his recliner. Pt's caregiver states the pt did not lose consciousness.

## 2013-09-29 NOTE — ED Notes (Signed)
Called [facility] to Transport pt. Back to facility

## 2013-09-29 NOTE — ED Notes (Signed)
Pt cleaned up and new brief applied to pt. Pt placed in paper scrubs to go home in as his clothes were soiled upon arrival to department.

## 2013-09-29 NOTE — ED Provider Notes (Signed)
CSN: [id number]     Arrival date & time 09/29/13  2022 History   First MD Initiated Contact with Patient 09/29/13 2025     Chief Complaint  Patient presents with  . Fall   (Consider location/radiation/quality/duration/timing/severity/associated sxs/prior Treatment) HPI Comments: Pt with hx of PD and Dementia (Level 5 caveat applies secondary to dementia) presents after having a fall - according to EMS - caregiver who lives with and cares for pt states she found him on the ground near a recliner - there was an unwitnessed fall prior to arrival, onset unknown but within last 24 hours.  He has recently been in a NH, they had trouble caring for him b/c of his wandering off.  He has been doing poorly at home b/c of high maintenance and according to EMS caregiver feels they can't keep taking care of him.    Patient is a 79 y.o. male presenting with fall. The history is provided by the patient, the EMS personnel and medical records.  Fall    Past Medical History  Diagnosis Date  . Hyperlipidemia   . Fatigue   . Parkinson disease     Sees neurology in [place]  . Depression   . Dementia     sees neurology in [place]  . Alcohol abuse   . Colon cancer     Last colonoscopy November 2011,  . Colon cancer   . Hyperglycemia   . Fall    Past Surgical History  Procedure Laterality Date  . Spinal fusion      back surgery for spinal stenosis  . Shoulder arthroscopy  4/11    s/p  . Colectomy     Family History  Problem Relation Age of Onset  . Liver cancer Brother   . Diabetes Neg Hx   . Coronary artery disease Neg Hx    History  Substance Use Topics  . Smoking status: Never Smoker   . Smokeless tobacco: Never Used  . Alcohol Use: No    Review of Systems  Unable to perform ROS: Dementia    Allergies  Review of patient's allergies indicates no known allergies.  Home Medications   Current Outpatient Rx  Name  Route  Sig  Dispense  Refill  . acetaminophen (TYLENOL) 500 MG tablet    Oral   Take 1,000 mg by mouth every 8 (eight) hours as needed for pain.         Marland Kitchen aspirin EC 81 MG tablet   Oral   Take 81 mg by mouth at bedtime.          . calcium-vitamin D (OSCAL WITH D) 500-200 MG-UNIT per tablet   Oral   Take 2 tablets by mouth daily with breakfast.         . carbidopa-levodopa (PARCOPA) 25-250 MG per disintegrating tablet   Oral   Take by mouth. One tablet in the morning, half tablet 4 times a day         . citalopram (CELEXA) 20 MG tablet   Oral   Take 20 mg by mouth daily.         . cyanocobalamin 500 MCG tablet   Oral   Take 500 mcg by mouth every morning.         . entacapone (COMTAN) 200 MG tablet   Oral   Take 200 mg by mouth 4 (four) times daily.          . fish oil-omega-3 fatty acids 1000 MG capsule  Oral   Take 1 g by mouth daily.          Marland Kitchen LORazepam (ATIVAN) 1 MG tablet   Oral   Take 1 tablet (1 mg total) by mouth at bedtime.   30 tablet   3   . Memantine HCl ER (NAMENDA XR) 28 MG CP24   Oral   Take 28 mg by mouth daily.          . Multiple Vitamins-Minerals (CENTRUM SILVER PO)   Oral   Take 1 tablet by mouth daily.         Marland Kitchen nystatin-triamcinolone (MYCOLOG II) cream   Topical   Apply topically 4 (four) times daily.   30 g   0   . QUEtiapine (SEROQUEL) 25 MG tablet   Oral   Take 25 mg by mouth at bedtime.         . Rivastigmine (EXELON) 13.3 MG/24HR PT24   Transdermal   Place 13.3 mg onto the skin daily.         Marland Kitchen rOPINIRole (REQUIP) 3 MG tablet   Oral   Take 3 mg by mouth 3 (three) times daily.          . simvastatin (ZOCOR) 20 MG tablet      TAKE 1 TABLET BY MOUTH AT BEDTIME   30 tablet   1   . triamcinolone cream (KENALOG) 0.1 %   Topical   Apply 1 application topically 2 (two) times daily as needed (itching/rash).         . vitamin C (ASCORBIC ACID) 500 MG tablet   Oral   Take 500 mg by mouth daily.           Marland Kitchen zolpidem (AMBIEN) 10 MG tablet   Oral   Take 1 tablet (10  mg total) by mouth at bedtime as needed for sleep.   30 tablet   3    BP 148/76  Pulse 65  Temp(Src) 97.8 F (36.6 C) (Oral)  Resp 16  Ht 5\' 11"  (1.803 m)  Wt 169 lb (76.658 kg)  BMI 23.58 kg/m2  SpO2 100% Physical Exam  Nursing note and vitals reviewed. Constitutional: He appears well-developed and well-nourished. No distress.  HENT:  Head: Normocephalic.  Mouth/Throat: Oropharynx is clear and moist. No oropharyngeal exudate.  Small bruise to the R zygoma with overlying skin tear.  No malocclusion, hemotympanum, racoon eyes or battle's sign.    Eyes: Conjunctivae and EOM are normal. Pupils are equal, round, and reactive to light. Right eye exhibits no discharge. Left eye exhibits no discharge. No scleral icterus.  Neck: Normal range of motion. Neck supple. No JVD present. No thyromegaly present.  Cardiovascular: Normal rate, regular rhythm, normal heart sounds and intact distal pulses.  Exam reveals no gallop and no friction rub.   No murmur heard. Pulmonary/Chest: Effort normal and breath sounds normal. No respiratory distress. He has no wheezes. He has no rales.  Abdominal: Soft. Bowel sounds are normal. He exhibits no distension and no mass. There is no tenderness.  Musculoskeletal: Normal range of motion. He exhibits tenderness ( minimal ttp over the R elbow but no swelling, or dec ROM - associated small skin tear to elbow). He exhibits no edema.  Lymphadenopathy:    He has no cervical adenopathy.  Neurological: He is alert. Coordination normal.  Pt has a tremor, memory loss, unsure where he lives or what the year is, no memory of fall.  Moves all extremities X 4 with normal  strength, can help self sit up  Skin: Skin is warm and dry. No rash noted. No erythema.  Skin tear to the r elbow.  Psychiatric: He has a normal mood and affect. His behavior is normal.    ED Course  Procedures (including critical care time) Labs Review Labs Reviewed  CBC - Abnormal; Notable for the  following:    RBC 4.15 (*)    Hemoglobin 12.1 (*)    HCT 35.8 (*)    RDW 15.6 (*)    Platelets 128 (*)    All other components within normal limits  URINALYSIS, ROUTINE W REFLEX MICROSCOPIC - Abnormal; Notable for the following:    Color, Urine AMBER (*)    Ketones, ur 15 (*)    All other components within normal limits  POCT I-STAT, CHEM 8 - Abnormal; Notable for the following:    BUN 26 (*)    Hemoglobin 12.6 (*)    HCT 37.0 (*)    All other components within normal limits  CK   Imaging Review Ct Head Wo Contrast  09/29/2013   CLINICAL DATA:  Status post fall; concern for head or cervical spine injury. Right-sided facial pain.  EXAM: CT HEAD WITHOUT CONTRAST  CT MAXILLOFACIAL WITHOUT CONTRAST  CT CERVICAL SPINE WITHOUT CONTRAST  TECHNIQUE: Multidetector CT imaging of the head, cervical spine, and maxillofacial structures were performed using the standard protocol without intravenous contrast. Multiplanar CT image reconstructions of the cervical spine and maxillofacial structures were also generated.  COMPARISON:  CT of the head and cervical spine performed 04/01/2013, and CT of the maxillofacial structures performed 03/06/2013  FINDINGS: CT HEAD FINDINGS  There is no evidence of acute infarction, mass lesion, or intra- or extra-axial hemorrhage on CT.  Prominence of the ventricles and sulci reflects moderate cortical volume loss. Underlying periventricular and subcortical white matter appears reflect small vessel ischemic microangiopathy. Cerebellar atrophy is noted.  The brainstem and fourth ventricle are within normal limits. The basal ganglia are unremarkable in appearance. The cerebral hemispheres demonstrate grossly normal gray-white differentiation. No mass effect or midline shift is seen.  There is no evidence of fracture; visualized osseous structures are unremarkable in appearance. The orbits are within normal limits. The paranasal sinuses and mastoid air cells are well-aerated. Soft  tissue swelling is noted overlying the right zygomatic arch.  CT MAXILLOFACIAL FINDINGS  There is no evidence of fracture or dislocation. The maxilla and mandible appear intact. The nasal bone is unremarkable in appearance. The visualized dentition demonstrates no acute abnormality, though multiple teeth are chronically absent.  The orbits are intact bilaterally. The visualized paranasal sinuses and mastoid air cells are well-aerated.  Soft tissue swelling is noted overlying the right zygomatic arch. The parapharyngeal fat planes are preserved. The nasopharynx, oropharynx and hypopharynx are unremarkable in appearance. The visualized portions of the valleculae and piriform sinuses are grossly unremarkable.  The parotid and submandibular glands are within normal limits. No cervical lymphadenopathy is seen.  CT CERVICAL SPINE FINDINGS  There is no evidence of fracture or subluxation. Vertebral bodies demonstrate normal height and alignment. There is multilevel disc space narrowing along the cervical spine, with scattered small anterior and posterior disc osteophyte complexes. Underlying facet disease is seen. Prevertebral soft tissues are within normal limits.  The thyroid gland is unremarkable in appearance. The visualized lung apices are clear. Calcification is noted at the carotid bifurcations bilaterally; retropharyngeal carotid arteries are incidentally noted.  IMPRESSION: CT HEAD IMPRESSION  1. No evidence of traumatic intracranial  injury or fracture. 2. Soft tissue swelling overlying the right zygomatic arch. 3. Moderate cortical volume loss and scattered small vessel ischemic microangiopathy.  CT MAXILLOFACIAL IMPRESSION  1. Soft tissue swelling overlying the right zygomatic arch. 2. No evidence of fracture or dislocation.  CT CERVICAL SPINE IMPRESSION  1. No evidence of fracture or subluxation along the cervical spine. 2. Degenerative change at the lower cervical spine. 3. Calcification at the carotid  bifurcations bilaterally; retropharyngeal carotid arteries incidentally noted. Carotid ultrasound could be considered for further evaluation when and as deemed clinically appropriate.   Electronically Signed   By: Roanna Raider M.D.   On: 09/29/2013 23:02   Ct Cervical Spine Wo Contrast  09/29/2013   CLINICAL DATA:  Status post fall; concern for head or cervical spine injury. Right-sided facial pain.  EXAM: CT HEAD WITHOUT CONTRAST  CT MAXILLOFACIAL WITHOUT CONTRAST  CT CERVICAL SPINE WITHOUT CONTRAST  TECHNIQUE: Multidetector CT imaging of the head, cervical spine, and maxillofacial structures were performed using the standard protocol without intravenous contrast. Multiplanar CT image reconstructions of the cervical spine and maxillofacial structures were also generated.  COMPARISON:  CT of the head and cervical spine performed 04/01/2013, and CT of the maxillofacial structures performed 03/06/2013  FINDINGS: CT HEAD FINDINGS  There is no evidence of acute infarction, mass lesion, or intra- or extra-axial hemorrhage on CT.  Prominence of the ventricles and sulci reflects moderate cortical volume loss. Underlying periventricular and subcortical white matter appears reflect small vessel ischemic microangiopathy. Cerebellar atrophy is noted.  The brainstem and fourth ventricle are within normal limits. The basal ganglia are unremarkable in appearance. The cerebral hemispheres demonstrate grossly normal gray-white differentiation. No mass effect or midline shift is seen.  There is no evidence of fracture; visualized osseous structures are unremarkable in appearance. The orbits are within normal limits. The paranasal sinuses and mastoid air cells are well-aerated. Soft tissue swelling is noted overlying the right zygomatic arch.  CT MAXILLOFACIAL FINDINGS  There is no evidence of fracture or dislocation. The maxilla and mandible appear intact. The nasal bone is unremarkable in appearance. The visualized dentition  demonstrates no acute abnormality, though multiple teeth are chronically absent.  The orbits are intact bilaterally. The visualized paranasal sinuses and mastoid air cells are well-aerated.  Soft tissue swelling is noted overlying the right zygomatic arch. The parapharyngeal fat planes are preserved. The nasopharynx, oropharynx and hypopharynx are unremarkable in appearance. The visualized portions of the valleculae and piriform sinuses are grossly unremarkable.  The parotid and submandibular glands are within normal limits. No cervical lymphadenopathy is seen.  CT CERVICAL SPINE FINDINGS  There is no evidence of fracture or subluxation. Vertebral bodies demonstrate normal height and alignment. There is multilevel disc space narrowing along the cervical spine, with scattered small anterior and posterior disc osteophyte complexes. Underlying facet disease is seen. Prevertebral soft tissues are within normal limits.  The thyroid gland is unremarkable in appearance. The visualized lung apices are clear. Calcification is noted at the carotid bifurcations bilaterally; retropharyngeal carotid arteries are incidentally noted.  IMPRESSION: CT HEAD IMPRESSION  1. No evidence of traumatic intracranial injury or fracture. 2. Soft tissue swelling overlying the right zygomatic arch. 3. Moderate cortical volume loss and scattered small vessel ischemic microangiopathy.  CT MAXILLOFACIAL IMPRESSION  1. Soft tissue swelling overlying the right zygomatic arch. 2. No evidence of fracture or dislocation.  CT CERVICAL SPINE IMPRESSION  1. No evidence of fracture or subluxation along the cervical spine. 2. Degenerative change at  the lower cervical spine. 3. Calcification at the carotid bifurcations bilaterally; retropharyngeal carotid arteries incidentally noted. Carotid ultrasound could be considered for further evaluation when and as deemed clinically appropriate.   Electronically Signed   By: Roanna Raider M.D.   On: 09/29/2013 23:02    Ct Maxillofacial Wo Cm  09/29/2013   CLINICAL DATA:  Status post fall; concern for head or cervical spine injury. Right-sided facial pain.  EXAM: CT HEAD WITHOUT CONTRAST  CT MAXILLOFACIAL WITHOUT CONTRAST  CT CERVICAL SPINE WITHOUT CONTRAST  TECHNIQUE: Multidetector CT imaging of the head, cervical spine, and maxillofacial structures were performed using the standard protocol without intravenous contrast. Multiplanar CT image reconstructions of the cervical spine and maxillofacial structures were also generated.  COMPARISON:  CT of the head and cervical spine performed 04/01/2013, and CT of the maxillofacial structures performed 03/06/2013  FINDINGS: CT HEAD FINDINGS  There is no evidence of acute infarction, mass lesion, or intra- or extra-axial hemorrhage on CT.  Prominence of the ventricles and sulci reflects moderate cortical volume loss. Underlying periventricular and subcortical white matter appears reflect small vessel ischemic microangiopathy. Cerebellar atrophy is noted.  The brainstem and fourth ventricle are within normal limits. The basal ganglia are unremarkable in appearance. The cerebral hemispheres demonstrate grossly normal gray-white differentiation. No mass effect or midline shift is seen.  There is no evidence of fracture; visualized osseous structures are unremarkable in appearance. The orbits are within normal limits. The paranasal sinuses and mastoid air cells are well-aerated. Soft tissue swelling is noted overlying the right zygomatic arch.  CT MAXILLOFACIAL FINDINGS  There is no evidence of fracture or dislocation. The maxilla and mandible appear intact. The nasal bone is unremarkable in appearance. The visualized dentition demonstrates no acute abnormality, though multiple teeth are chronically absent.  The orbits are intact bilaterally. The visualized paranasal sinuses and mastoid air cells are well-aerated.  Soft tissue swelling is noted overlying the right zygomatic arch. The  parapharyngeal fat planes are preserved. The nasopharynx, oropharynx and hypopharynx are unremarkable in appearance. The visualized portions of the valleculae and piriform sinuses are grossly unremarkable.  The parotid and submandibular glands are within normal limits. No cervical lymphadenopathy is seen.  CT CERVICAL SPINE FINDINGS  There is no evidence of fracture or subluxation. Vertebral bodies demonstrate normal height and alignment. There is multilevel disc space narrowing along the cervical spine, with scattered small anterior and posterior disc osteophyte complexes. Underlying facet disease is seen. Prevertebral soft tissues are within normal limits.  The thyroid gland is unremarkable in appearance. The visualized lung apices are clear. Calcification is noted at the carotid bifurcations bilaterally; retropharyngeal carotid arteries are incidentally noted.  IMPRESSION: CT HEAD IMPRESSION  1. No evidence of traumatic intracranial injury or fracture. 2. Soft tissue swelling overlying the right zygomatic arch. 3. Moderate cortical volume loss and scattered small vessel ischemic microangiopathy.  CT MAXILLOFACIAL IMPRESSION  1. Soft tissue swelling overlying the right zygomatic arch. 2. No evidence of fracture or dislocation.  CT CERVICAL SPINE IMPRESSION  1. No evidence of fracture or subluxation along the cervical spine. 2. Degenerative change at the lower cervical spine. 3. Calcification at the carotid bifurcations bilaterally; retropharyngeal carotid arteries incidentally noted. Carotid ultrasound could be considered for further evaluation when and as deemed clinically appropriate.   Electronically Signed   By: Roanna Raider M.D.   On: 09/29/2013 23:02    MDM   1. Fall, initial encounter   2. Contusion of face, initial encounter  Pt has dementia - he has no focal neuro defecits beyond what is likely his baseline but does have signs of facial trauma and unwitnessed fall.  Sounds as though pt needs  higher level of care for safety reasons.  ED ECG REPORT  I personally interpreted this EKG   Date: 09/29/2013   Rate: 64  Rhythm: normal sinus rhythm  QRS Axis: normal  Intervals: normal  ST/T Wave abnormalities: normal  Conduction Disutrbances:none  Narrative Interpretation:   Old EKG Reviewed: none available   D/w caregiver - states that she was in the other room when he fell - she heard him fall.  States he has been falling a lot at home and she is concerned for his well being.  She states that she wants him to come back home tonight and she is trying to get him placed in a NH for dementia pt's and he sees his PMD on Monday - Dr. [name].  Labs and UA normal, CT without acute findings.  Pt stable for d/c.  Vida Roller, MD 09/29/13 [phone]

## 2013-09-29 NOTE — ED Notes (Signed)
Pt states he has never been in a doctors office this long and has to go. Pt reoriented that he is in the ED at [facility] and that he will be with [place] for a long time.

## 2013-09-29 NOTE — ED Notes (Signed)
Address confirmed with pt's cargiver, Carney Bern. [facility] called to transport pt home.

## 2013-09-30 NOTE — ED Notes (Signed)
Waiting on [facility] to transport the pt home.

## 2013-10-03 ENCOUNTER — Encounter: Payer: Self-pay | Admitting: Lab

## 2013-10-06 ENCOUNTER — Ambulatory Visit: Payer: Self-pay | Admitting: Internal Medicine

## 2013-10-24 DIAGNOSIS — G030 Nonpyogenic meningitis: Secondary | ICD-10-CM

## 2013-10-24 DIAGNOSIS — R413 Other amnesia: Secondary | ICD-10-CM

## 2013-10-24 DIAGNOSIS — I359 Nonrheumatic aortic valve disorder, unspecified: Secondary | ICD-10-CM

## 2013-10-24 DIAGNOSIS — R627 Adult failure to thrive: Secondary | ICD-10-CM

## 2013-10-24 DIAGNOSIS — R269 Unspecified abnormalities of gait and mobility: Secondary | ICD-10-CM

## 2013-10-30 ENCOUNTER — Telehealth: Payer: Self-pay | Admitting: *Deleted

## 2013-10-30 ENCOUNTER — Telehealth: Payer: Self-pay | Admitting: Internal Medicine

## 2013-10-30 NOTE — Telephone Encounter (Signed)
error 

## 2013-10-30 NOTE — Telephone Encounter (Signed)
Patient mother came in and wanted to see if dr [name] had filled out her son FL2 forms.

## 2013-10-30 NOTE — Telephone Encounter (Signed)
I think is the patient's spouse.  paperwork in process

## 2013-11-03 ENCOUNTER — Emergency Department (HOSPITAL_COMMUNITY): Payer: MEDICARE

## 2013-11-03 ENCOUNTER — Emergency Department (HOSPITAL_COMMUNITY)
Admission: EM | Admit: 2013-11-03 | Discharge: 2013-11-03 | Disposition: A | Discharge status: Home / Self Care | Payer: MEDICARE | Attending: Emergency Medicine | Admitting: Emergency Medicine

## 2013-11-03 ENCOUNTER — Encounter (HOSPITAL_COMMUNITY): Payer: Self-pay | Admitting: Emergency Medicine

## 2013-11-03 DIAGNOSIS — D72829 Elevated white blood cell count, unspecified: Secondary | ICD-10-CM | POA: Insufficient documentation

## 2013-11-03 DIAGNOSIS — Z85038 Personal history of other malignant neoplasm of large intestine: Secondary | ICD-10-CM | POA: Insufficient documentation

## 2013-11-03 DIAGNOSIS — Z79899 Other long term (current) drug therapy: Secondary | ICD-10-CM | POA: Insufficient documentation

## 2013-11-03 DIAGNOSIS — F329 Major depressive disorder, single episode, unspecified: Secondary | ICD-10-CM | POA: Insufficient documentation

## 2013-11-03 DIAGNOSIS — G20 Parkinson's disease: Secondary | ICD-10-CM | POA: Insufficient documentation

## 2013-11-03 DIAGNOSIS — F1010 Alcohol abuse, uncomplicated: Secondary | ICD-10-CM | POA: Insufficient documentation

## 2013-11-03 DIAGNOSIS — M25551 Pain in right hip: Secondary | ICD-10-CM

## 2013-11-03 DIAGNOSIS — I959 Hypotension, unspecified: Secondary | ICD-10-CM | POA: Insufficient documentation

## 2013-11-03 DIAGNOSIS — W19XXXA Unspecified fall, initial encounter: Secondary | ICD-10-CM | POA: Insufficient documentation

## 2013-11-03 DIAGNOSIS — M6281 Muscle weakness (generalized): Secondary | ICD-10-CM | POA: Insufficient documentation

## 2013-11-03 DIAGNOSIS — E785 Hyperlipidemia, unspecified: Secondary | ICD-10-CM | POA: Insufficient documentation

## 2013-11-03 DIAGNOSIS — R259 Unspecified abnormal involuntary movements: Secondary | ICD-10-CM | POA: Insufficient documentation

## 2013-11-03 DIAGNOSIS — Y939 Activity, unspecified: Secondary | ICD-10-CM | POA: Insufficient documentation

## 2013-11-03 DIAGNOSIS — F0390 Unspecified dementia without behavioral disturbance: Secondary | ICD-10-CM | POA: Insufficient documentation

## 2013-11-03 DIAGNOSIS — Z7982 Long term (current) use of aspirin: Secondary | ICD-10-CM | POA: Insufficient documentation

## 2013-11-03 DIAGNOSIS — Y92009 Unspecified place in unspecified non-institutional (private) residence as the place of occurrence of the external cause: Secondary | ICD-10-CM | POA: Insufficient documentation

## 2013-11-03 DIAGNOSIS — M25559 Pain in unspecified hip: Secondary | ICD-10-CM | POA: Insufficient documentation

## 2013-11-03 DIAGNOSIS — F3289 Other specified depressive episodes: Secondary | ICD-10-CM | POA: Insufficient documentation

## 2013-11-03 DIAGNOSIS — G20A1 Parkinson's disease without dyskinesia, without mention of fluctuations: Secondary | ICD-10-CM | POA: Insufficient documentation

## 2013-11-03 LAB — CBC WITH DIFFERENTIAL/PLATELET
Basophils Absolute: 0 10*3/uL (ref 0.0–0.1)
Basophils Relative: 0 % (ref 0–1)
Eosinophils Absolute: 0.1 10*3/uL (ref 0.0–0.7)
Eosinophils Relative: 1 % (ref 0–5)
HCT: 35.6 % — ABNORMAL LOW (ref 39.0–52.0)
Lymphocytes Relative: 18 % (ref 12–46)
MCHC: 34.6 g/dL (ref 30.0–36.0)
MCV: 85.6 fL (ref 78.0–100.0)
Monocytes Absolute: 0.6 10*3/uL (ref 0.1–1.0)
Neutro Abs: 3.9 10*3/uL (ref 1.7–7.7)
Neutrophils Relative %: 70 % (ref 43–77)
Platelets: 124 10*3/uL — ABNORMAL LOW (ref 150–400)
RDW: 15.6 % — ABNORMAL HIGH (ref 11.5–15.5)
WBC: 5.5 10*3/uL (ref 4.0–10.5)

## 2013-11-03 LAB — URINALYSIS, ROUTINE W REFLEX MICROSCOPIC
Bilirubin Urine: NEGATIVE
Ketones, ur: 15 mg/dL — AB
Nitrite: NEGATIVE
Urobilinogen, UA: 1 mg/dL (ref 0.0–1.0)

## 2013-11-03 LAB — COMPREHENSIVE METABOLIC PANEL
ALT: 5 U/L (ref 0–53)
AST: 21 U/L (ref 0–37)
Albumin: 3.4 g/dL — ABNORMAL LOW (ref 3.5–5.2)
Alkaline Phosphatase: 83 U/L (ref 39–117)
CO2: 27 mEq/L (ref 19–32)
Chloride: 105 mEq/L (ref 96–112)
GFR calc non Af Amer: 85 mL/min — ABNORMAL LOW (ref >90)
Potassium: 4.4 mEq/L (ref 3.5–5.1)
Sodium: 140 mEq/L (ref 135–145)
Total Bilirubin: 0.8 mg/dL (ref 0.3–1.2)

## 2013-11-03 LAB — URINE MICROSCOPIC-ADD ON

## 2013-11-03 MED ORDER — SODIUM CHLORIDE 0.9 % IV BOLUS (SEPSIS)
500.0000 mL | Freq: Once | INTRAVENOUS | Status: AC
  Administered 2013-11-03: 500 mL via INTRAVENOUS

## 2013-11-03 NOTE — Progress Notes (Addendum)
ED CM called by Maralyn Sago RN in regards to possible placement [facility] issues.  Pt presents to ED via EMS with stroke like symptoms. Pt has hx. of dementia  Parkinson disease, Hyperglycemia, Colon cancer , and is non ambulatory using w/c at home. Lives with his elderly friend POA  Izora Gala who is his caregiver. Mrs. Jaclynn Major states, patient fell from w/c around 2 am this morning, and she was not able to get him up off the floor, so she made him comfortable until she was able to get help this am. When questioned if she called for help, she stated that she has called the fire dept. So many times to get him until she just thought she would wait She reports that she and her son are working on having him placed. She reports that she spoke with son this morning and said he may have a bed at [facility] burn in [place] tomorrow.  She states, that the [facility] has been completed already.Caregiver have a  plan in place.  Discussed plan with Dr. Jodi Mourning and Maralyn Sago RN. Caregiver was given [facility] pamphlets to familiarize her with [facility] services in cases a need arises in the future. No further CM needs identified. Marland Kitchen

## 2013-11-03 NOTE — ED Notes (Signed)
Pt friend/caregiver at beside. Phlebotomy at bedside.

## 2013-11-03 NOTE — ED Notes (Signed)
Pt caregiver took all pt belongings home.

## 2013-11-03 NOTE — ED Notes (Signed)
Per EMS- Pt comes from home where he lives with a woman. Has hx of dementia/alzheimers/parkinsons. Pt is in wheelchair mostly, today a neighbor came over to assist patient to move into wheelchair. They neighbor thought the pt was having a stoke and called EMS. There is no weakness, equal grip strengths, pt is alert and oriented, following commands. BP was found to be 77/40 given 500 cc NS, CBG 143, HR 60.  EMS reports poor living conditions.

## 2013-11-03 NOTE — ED Notes (Signed)
Patient transported to CT 

## 2013-11-03 NOTE — ED Notes (Signed)
Pt made aware that he is waiting for [facility] to come take him home.

## 2013-11-03 NOTE — ED Provider Notes (Signed)
CSN: [id number]     Arrival date & time 11/03/13  1440 History   First MD Initiated Contact with Patient 11/03/13 1459     Chief Complaint  Patient presents with  . Hypotension   (Consider location/radiation/quality/duration/timing/severity/associated sxs/prior Treatment) The history is provided by the patient, a caregiver and the EMS personnel. No language interpreter was used.   Patient is an 79 year old Caucasian male with past medical history of Parkinson's and recent hypotension who comes emergency department today with hypotension. Uncertain with hypotension for the past 4 months has been evaluated several times his primary care physician's office. His blood pressures ranged anywhere from 70-100 SBP. He lives at home by himself and has a caregiver who checks on him a couple times a day. This morning his caregiver found him lying on the floor. She is in the colon. However the patient cannot remember anything about the incident. Patient stated her system of the floor. Her neighbor tried he didn't follow commands.  The first command he gave the patient he did not follow.  The neighbor was concerned the patient may be suffering from a stroke and contacted EMS. Immediately after calling 911 the patient been acting normally and following commands. Patient has no complaints currently. He denies pain. He denies headache, nausea, vomiting, fevers, chills, or shortness of breath. He is currently at his baseline mental status per his caregiver.   Past Medical History  Diagnosis Date  . Hyperlipidemia   . Fatigue   . Parkinson disease     Sees neurology in [place]  . Depression   . Dementia     sees neurology in [place]  . Alcohol abuse   . Colon cancer     Last colonoscopy November 2011,  . Colon cancer   . Hyperglycemia   . Fall    Past Surgical History  Procedure Laterality Date  . Spinal fusion      back surgery for spinal stenosis  . Shoulder arthroscopy  4/11    s/p  . Colectomy     Family  History  Problem Relation Age of Onset  . Liver cancer Brother   . Diabetes Neg Hx   . Coronary artery disease Neg Hx    History  Substance Use Topics  . Smoking status: Never Smoker   . Smokeless tobacco: Never Used  . Alcohol Use: No    Review of Systems  Constitutional: Negative for fever and chills.  HENT: Negative for drooling.   Respiratory: Negative for cough and shortness of breath.   Cardiovascular: Negative for chest pain.  Gastrointestinal: Negative for nausea, vomiting, diarrhea and constipation.  Genitourinary: Negative for dysuria, urgency and frequency.  Musculoskeletal: Negative for neck pain and neck stiffness.  Neurological: Negative for facial asymmetry, weakness, light-headedness, numbness and headaches.  Psychiatric/Behavioral: Negative for confusion.  All other systems reviewed and are negative.    Allergies  Review of patient's allergies indicates no known allergies.  Home Medications   Current Outpatient Rx  Name  Route  Sig  Dispense  Refill  . acetaminophen (TYLENOL) 500 MG tablet   Oral   Take 1,000 mg by mouth every 8 (eight) hours as needed for pain.         Marland Kitchen aspirin EC 81 MG tablet   Oral   Take 81 mg by mouth at bedtime.          . calcium-vitamin D (OSCAL WITH D) 500-200 MG-UNIT per tablet   Oral   Take 2 tablets by  mouth daily with breakfast.         . carbidopa-levodopa (PARCOPA) 25-250 MG per disintegrating tablet   Oral   Take by mouth. One tablet in the morning, half tablet 4 times a day         . citalopram (CELEXA) 20 MG tablet   Oral   Take 20 mg by mouth daily.         . cyanocobalamin 500 MCG tablet   Oral   Take 500 mcg by mouth every morning.         . entacapone (COMTAN) 200 MG tablet   Oral   Take 200 mg by mouth 4 (four) times daily.          . fish oil-omega-3 fatty acids 1000 MG capsule   Oral   Take 1 g by mouth daily.          Marland Kitchen LORazepam (ATIVAN) 1 MG tablet   Oral   Take 1 tablet  (1 mg total) by mouth at bedtime.   30 tablet   3   . Memantine HCl ER (NAMENDA XR) 28 MG CP24   Oral   Take 28 mg by mouth daily.          . Multiple Vitamins-Minerals (CENTRUM SILVER PO)   Oral   Take 1 tablet by mouth daily.         Marland Kitchen nystatin-triamcinolone (MYCOLOG II) cream   Topical   Apply topically 4 (four) times daily.   30 g   0   . QUEtiapine (SEROQUEL) 25 MG tablet   Oral   Take 25 mg by mouth at bedtime.         . Rivastigmine (EXELON) 13.3 MG/24HR PT24   Transdermal   Place 13.3 mg onto the skin daily.         Marland Kitchen rOPINIRole (REQUIP) 3 MG tablet   Oral   Take 3 mg by mouth 3 (three) times daily.          . simvastatin (ZOCOR) 20 MG tablet      TAKE 1 TABLET BY MOUTH AT BEDTIME   30 tablet   1   . triamcinolone cream (KENALOG) 0.1 %   Topical   Apply 1 application topically 2 (two) times daily as needed (itching/rash).         . vitamin C (ASCORBIC ACID) 500 MG tablet   Oral   Take 500 mg by mouth daily.          Marland Kitchen zolpidem (AMBIEN) 10 MG tablet   Oral   Take 1 tablet (10 mg total) by mouth at bedtime as needed for sleep.   30 tablet   3    BP 97/80  Pulse 68  Temp(Src) 97.6 F (36.4 C) (Oral)  Resp 16  SpO2 98% Physical Exam  Nursing note and vitals reviewed. Constitutional: He is oriented to person, place, and time. He appears well-developed and well-nourished. No distress.  HENT:  Head: Normocephalic and atraumatic.  Eyes: Pupils are equal, round, and reactive to light.  Neck: Normal range of motion.  Cardiovascular: Normal rate, regular rhythm and normal heart sounds.   Pulmonary/Chest: Effort normal and breath sounds normal. No respiratory distress. He has no decreased breath sounds. He has no wheezes. He has no rhonchi. He has no rales.  Abdominal: Soft. He exhibits no distension. There is no tenderness. There is no rebound and no guarding.  Musculoskeletal: He exhibits no edema and no tenderness.  Neurological: He  is  alert and oriented to person, place, and time. He has normal strength. He displays tremor (bilateral arms and legs). No cranial nerve deficit or sensory deficit. He exhibits normal muscle tone.  Sensation intact to bilateral upper and lower extremities.  Strength 5/5 bilateral upper and lower extremities.    Skin: Skin is warm and dry.    ED Course  Procedures (including critical care time) Labs Review Labs Reviewed  CBC WITH DIFFERENTIAL - Abnormal; Notable for the following:    RBC 4.16 (*)    Hemoglobin 12.3 (*)    HCT 35.6 (*)    RDW 15.6 (*)    Platelets 124 (*)    All other components within normal limits  COMPREHENSIVE METABOLIC PANEL - Abnormal; Notable for the following:    BUN 25 (*)    Albumin 3.4 (*)    GFR calc non Af Amer 85 (*)    All other components within normal limits  URINALYSIS, ROUTINE W REFLEX MICROSCOPIC - Abnormal; Notable for the following:    Color, Urine ORANGE (*)    Ketones, ur 15 (*)    Protein, ur 30 (*)    Leukocytes, UA SMALL (*)    All other components within normal limits  URINE MICROSCOPIC-ADD ON - Abnormal; Notable for the following:    Casts HYALINE CASTS (*)    All other components within normal limits  TROPONIN I  LACTIC ACID, PLASMA   Imaging Review Dg Chest 1 View  11/03/2013   CLINICAL DATA:  Right hip pain following a fall last night.  EXAM: CHEST - 1 VIEW  COMPARISON:  07/23/2013.  FINDINGS: The cardiac silhouette remains borderline enlarged. Clear lungs. No fracture or pneumothorax seen. Mild to moderate scoliosis.  IMPRESSION: Stable borderline cardiomegaly. No acute abnormality.   Electronically Signed   By: Gordan Payment M.D.   On: 11/03/2013 17:24   Dg Hip Complete Right  11/03/2013   CLINICAL DATA:  Pain. Fall.  EXAM: RIGHT HIP - COMPLETE 2+ VIEW  COMPARISON:  None.  FINDINGS: Diffuse osteopenia. Degenerative changes lumbar spine, both SI joints, and both hips. No fracture. Metallic density debris versus foreign bodies noted  over the right hip. Phleboliths noted in the pelvis. Stool in colon. Aortoiliac and femoral vascular calcification.  IMPRESSION: Degenerative change. No acute bony abnormality.   Electronically Signed   By: Maisie Fus  Register   On: 11/03/2013 17:10   Ct Head Wo Contrast  11/03/2013   CLINICAL DATA:  Possible acute stroke. Alzheimer's dementia with Parkinson's disease.  EXAM: CT HEAD WITHOUT CONTRAST  TECHNIQUE: Contiguous axial images were obtained from the base of the skull through the vertex without contrast.  COMPARISON:  09/29/2013  FINDINGS: No evidence for acute infarction, hemorrhage, mass lesion, hydrocephalus, or extra-axial fluid. There is advanced atrophy with chronic microvascular ischemic change. Advanced vascular calcification is noted. Calvarium intact. There is no sinus disease. Negative mastoids. Similar appearance to priors.  IMPRESSION: Chronic changes as described. No acute intracranial abnormality.   Electronically Signed   By: Davonna Belling M.D.   On: 11/03/2013 16:30    EKG Interpretation   None      Date: 11/03/2013  Rate: 64  Rhythm: normal sinus rhythm  QRS Axis: normal  Intervals: normal  ST/T Wave abnormalities: nonspecific ST changes  Conduction Disutrbances:none  Narrative Interpretation:   Old EKG Reviewed: unchanged    MDM  Patient is a 79 year old Caucasian male with past medical history of dementia who comes emergency department today  after a fall with hypotension. Physical exam as above. Initial blood pressure emergency Department was 90/70. Patient was treated with 500 mils of normal saline which improved his blood pressures 130s to 140s. His workup included a UA, CBC, CMP, troponin, lactic acid, right hip x-ray, chest x-ray, and CT of his head. UA demonstrates small leukocytes with rare epithelial cells. With no fevers, no tachycardia, no dysuria, frequency, and altered mental status doubt urinary tract infection. CBC hemoglobin 12.3 the patient's baseline  otherwise unremarkable. CMP was unremarkable. Troponin was negative. Lactic acid is 1.4. Right hip x-ray demonstrated degenerative changes no fractures dislocations. Chest x-ray was unremarkable. CT of the head demonstrated no acute changes. With no neurologic deficits doubt CVA. With no cough, no fever, no chills, and altered mental status doubt pneumonia. On emergency department the patient's blood pressures remained stable in the 120s to 140s. Review of records demonstrated patient has been having blood pressures as low as 70/40 for the past 5 months. He has been evaluated by his primary care physician and in the emergency department and there has been no cause as of yet found for his hypotension. With benign exam and unremarkable labs and imaging patient was felt to be stable for discharge. Patient was discharged in stable condition. Instructed to followup with his primary care physician within the week. He was instructed to return to emergency department develops weakness, numbness, lethargy, or dizziness. Patient and his caregiver expressed understanding. With his poor living conditions reported by EMS case management was consulted. They evaluated patient emergency department. Patient is scheduled to be admitted to a nursing home next 2-3 days. Patient was instructed to continue with this plan. He does caregiver excessive standing. He was discharged in stable condition. Labs and imaging reviewed by myself and considered and medical decision-making. Imaging was to radiology. Care was discussed with my attending Dr. Jodi Mourning.    1. Hypotension   2. Fall, initial encounter   3. Right hip pain        Bethann Berkshire, MD 11/04/13 0111

## 2013-11-03 NOTE — ED Notes (Signed)
Pt undressed, in gown, on monitor, continuous pulse oximetry and blood pressure cuff 

## 2013-11-03 NOTE — ED Notes (Signed)
Spoke with case management about patient living conditions. RN concerned about patient needing more skilled assistance than caregiver at bedside. Case management going in to see patient.

## 2013-11-03 NOTE — ED Notes (Signed)
While cleaning patient, noted to have small piece of glass in diaper. Caregiver reports that they broke a glass in the kitchen in the then the patient sat in the chair and it got in his diaper. Pt noted to have died stool to bottom and to inside of jeans.

## 2013-11-03 NOTE — ED Notes (Signed)
Case management speaking with patient and caregiver at bedside. Per caregiver, plan is in place to get patient placed in a nursing home. May have bed at [facility] as early as tomorrow. We have called the ambulance to transport patient home as he is up for discharge.

## 2013-11-04 NOTE — ED Provider Notes (Signed)
Medical screening examination/treatment/procedure(s) were conducted as a shared visit with non-physician practitioner(s) or resident and myself. I personally evaluated the patient during the encounter and agree with the findings and plan unless otherwise indicated.  I have personally reviewed any xrays and/ or EKG's with the provider and I agree with interpretation.  Unwitnessed fall, pt has Parkinson's/ dementia so falling is not infrequent. Pt had episode earlier today where he was not as alert/ interactive but that has resolved, no focal findings, pt was just generally weak and bp in 70s. Hypotension recurrent issue. Decreased po intake. Exam dry mm, moves all ext equal with mild tremor, perrl, eomfi, full rom of shoulders and hips without discomfort. No bruising or wounds. Neck full rom, no vertebral tenderness. Pt alert/ answers questions. At baseline per family/ caregiver. Plan for labs, fluid bolus, CT head and xray right hip. Pt in wheel chair normally. No acute findings in workup, pt at baseline.  Fup outpt. Hx of hypotension, bp improved with fluids.  Fall, Parkinsons, Hypotension   Enid Skeens, MD 11/04/13 [id number]

## 2013-11-10 ENCOUNTER — Encounter: Payer: Self-pay | Admitting: Internal Medicine

## 2013-11-10 ENCOUNTER — Ambulatory Visit (INDEPENDENT_AMBULATORY_CARE_PROVIDER_SITE_OTHER): Payer: MEDICARE | Admitting: Internal Medicine

## 2013-11-10 VITALS — BP 122/72 | HR 70 | Temp 98.8°F

## 2013-11-10 DIAGNOSIS — I959 Hypotension, unspecified: Secondary | ICD-10-CM

## 2013-11-10 DIAGNOSIS — Z23 Encounter for immunization: Secondary | ICD-10-CM

## 2013-11-10 DIAGNOSIS — R609 Edema, unspecified: Secondary | ICD-10-CM

## 2013-11-10 NOTE — Patient Instructions (Signed)
Take medications as prescribed

## 2013-11-10 NOTE — Progress Notes (Signed)
  Subjective:    Patient ID: Dale Wong, male    DOB: 1927-10-21, 79 y.o.   MRN: [id number]  HPI ER followup Micah Flesher to the ER 11/03/2013,  initially BP was 90/70, after IV fluids, got better to the 130s. Chest x-ray, right hip x-ray, CMP, EKG, blood and urine test were unremarkable or  nonacute Here for followup.  Past Medical History  Diagnosis Date  . Hyperlipidemia   . Fatigue   . Parkinson disease     Sees neurology in [place]  . Depression   . Dementia     sees neurology in [place]  . Alcohol abuse   . Colon cancer     Last colonoscopy November 2011,  . Colon cancer   . Hyperglycemia   . Fall    Past Surgical History  Procedure Laterality Date  . Spinal fusion      back surgery for spinal stenosis  . Shoulder arthroscopy  4/11    s/p  . Colectomy     History   Social History  . Marital Status: Married    Spouse Name: N/A    Number of Children: 0  . Years of Education: N/A   Occupational History  . Retired    Social History Main Topics  . Smoking status: Never Smoker   . Smokeless tobacco: Never Used  . Alcohol Use: No  . Drug Use: No  . Sexual Activity: No   Other Topics Concern  . Not on file   Social History Narrative    Divorced, left [place] 09-13-2013, to be admited to [facility]  ASAP were he will get more support; significant  otherJean Joesphine Bare, Jean's son Kathlene November also involved in his care)       Stopped driving ~ [id number]      Review of Systems Since he left the ER he is doing well, he still has occasional hallucinations. Neurology recommended physical therapy at some point however the patient has not been able to participate so we won't pursue physical therapy. Medication list was reviewed and corrected. His by mouth intake for solids and fluids is good.     Objective:   Physical Exam BP 122/72  Pulse 70  Temp(Src) 98.8 F (37.1 C) (Oral)  SpO2 98% General -- alert,  In a wheelchairNAD.   Lungs -- normal respiratory  effort, no intercostal retractions, no accessory muscle use, and normal breath sounds.  Heart-- normal rate, regular rhythm, + syst murmur.   Extremities-- trace pretibial edema bilaterally  Neurologic--  pleasently demented, follows simple commands ; ++ involuntary movements       Assessment & Plan:   Hypotension, Recently seen at the ER with BP, better after IV fluids. BP today better, Reportedly has good po intake.. Plan: No change, at some point consider Florinef, see previous office visit note  Edema, Lasix was d/c due to low BP, observation for now, does not seem to be retaining fluids   Rash, right elbow, Keep  Mycolog in his med list to be used prn   Placement, Moving to [facility] ASAP  Today , I spent more than 25 min with the patient, >50% of the time counseling,   reviewing the chart and and answering questions

## 2013-11-11 ENCOUNTER — Encounter: Payer: Self-pay | Admitting: Internal Medicine

## 2013-11-13 ENCOUNTER — Telehealth: Payer: Self-pay | Admitting: Internal Medicine

## 2013-11-13 ENCOUNTER — Other Ambulatory Visit: Payer: Self-pay | Admitting: *Deleted

## 2013-11-13 NOTE — Telephone Encounter (Signed)
Medication list updated. Caretaker called and stated they would be by tomorrow morning to pick it up.

## 2013-11-13 NOTE — Telephone Encounter (Signed)
Wende Bushy, patient's caretaker's son is calling in regards to the patient's medication list that they received at his OV Monday, 11/17. He states that the patient is scheduled to be admitted to his new nursing home facility tomorrow and they are needing his medication list to be corrected before they take him. Mr. Joesphine Bare is asking that we call him when complete so that they can come by and pick up tomorrow. Please advise.   carbidopa-levodopa (PARCOPA) 25-250 MG In directions remove "One tablet in the morning, half tablet 4 times a day"  zolpidem (AMBIEN) 10 MG tablet  It currently says "Take 5 mg by mouth at bedtime as needed for sleep."  Needs to say take "1 tablet by mouth" so that the nursing home facility does not think they need to cut the tablet in half.   calcium-vitamin D (OSCAL WITH D) 500-200 MG-UNIT per tablet They need clarification on if Dr. [name] wants him to take 1 or 2 tablets by mouth. It currently says to take 1. Mr. Joesphine Bare says that he thought he was supposed to be taking 2.

## 2013-12-14 ENCOUNTER — Other Ambulatory Visit: Payer: Self-pay | Admitting: Internal Medicine

## 2013-12-15 NOTE — Telephone Encounter (Signed)
rx refilled per protocol. DJR  

## 2014-02-26 ENCOUNTER — Inpatient Hospital Stay (HOSPITAL_COMMUNITY)
Admission: EM | Admit: 2014-02-26 | Discharge: 2014-02-27 | DRG: 177 | Disposition: A | Discharge status: Hospice | Payer: MEDICARE | Attending: Internal Medicine | Admitting: Internal Medicine

## 2014-02-26 ENCOUNTER — Encounter (HOSPITAL_COMMUNITY): Payer: Self-pay | Admitting: Emergency Medicine

## 2014-02-26 ENCOUNTER — Emergency Department (HOSPITAL_COMMUNITY): Payer: MEDICARE

## 2014-02-26 DIAGNOSIS — F411 Generalized anxiety disorder: Secondary | ICD-10-CM | POA: Diagnosis present

## 2014-02-26 DIAGNOSIS — R0682 Tachypnea, not elsewhere classified: Secondary | ICD-10-CM | POA: Diagnosis present

## 2014-02-26 DIAGNOSIS — J984 Other disorders of lung: Secondary | ICD-10-CM | POA: Diagnosis present

## 2014-02-26 DIAGNOSIS — F0390 Unspecified dementia without behavioral disturbance: Secondary | ICD-10-CM | POA: Diagnosis present

## 2014-02-26 DIAGNOSIS — F1010 Alcohol abuse, uncomplicated: Secondary | ICD-10-CM | POA: Diagnosis present

## 2014-02-26 DIAGNOSIS — R0600 Dyspnea, unspecified: Secondary | ICD-10-CM

## 2014-02-26 DIAGNOSIS — Z79899 Other long term (current) drug therapy: Secondary | ICD-10-CM

## 2014-02-26 DIAGNOSIS — W19XXXA Unspecified fall, initial encounter: Secondary | ICD-10-CM

## 2014-02-26 DIAGNOSIS — Z85038 Personal history of other malignant neoplasm of large intestine: Secondary | ICD-10-CM

## 2014-02-26 DIAGNOSIS — J690 Pneumonitis due to inhalation of food and vomit: Principal | ICD-10-CM | POA: Diagnosis present

## 2014-02-26 DIAGNOSIS — R0609 Other forms of dyspnea: Secondary | ICD-10-CM

## 2014-02-26 DIAGNOSIS — G20 Parkinson's disease: Secondary | ICD-10-CM | POA: Diagnosis present

## 2014-02-26 DIAGNOSIS — Z66 Do not resuscitate: Secondary | ICD-10-CM | POA: Diagnosis present

## 2014-02-26 DIAGNOSIS — Z7982 Long term (current) use of aspirin: Secondary | ICD-10-CM

## 2014-02-26 DIAGNOSIS — Z515 Encounter for palliative care: Secondary | ICD-10-CM

## 2014-02-26 DIAGNOSIS — G20A1 Parkinson's disease without dyskinesia, without mention of fluctuations: Secondary | ICD-10-CM | POA: Diagnosis present

## 2014-02-26 DIAGNOSIS — R0681 Apnea, not elsewhere classified: Secondary | ICD-10-CM | POA: Diagnosis present

## 2014-02-26 DIAGNOSIS — F3289 Other specified depressive episodes: Secondary | ICD-10-CM | POA: Diagnosis present

## 2014-02-26 DIAGNOSIS — E870 Hyperosmolality and hypernatremia: Secondary | ICD-10-CM | POA: Diagnosis present

## 2014-02-26 DIAGNOSIS — R0603 Acute respiratory distress: Secondary | ICD-10-CM

## 2014-02-26 DIAGNOSIS — F329 Major depressive disorder, single episode, unspecified: Secondary | ICD-10-CM | POA: Diagnosis present

## 2014-02-26 DIAGNOSIS — E785 Hyperlipidemia, unspecified: Secondary | ICD-10-CM | POA: Diagnosis present

## 2014-02-26 DIAGNOSIS — R0989 Other specified symptoms and signs involving the circulatory and respiratory systems: Secondary | ICD-10-CM

## 2014-02-26 DIAGNOSIS — J9600 Acute respiratory failure, unspecified whether with hypoxia or hypercapnia: Secondary | ICD-10-CM | POA: Diagnosis present

## 2014-02-26 LAB — CBC WITH DIFFERENTIAL/PLATELET
Basophils Absolute: 0 10*3/uL (ref 0.0–0.1)
Basophils Relative: 0 % (ref 0–1)
Eosinophils Absolute: 0 10*3/uL (ref 0.0–0.7)
Eosinophils Relative: 0 % (ref 0–5)
HEMATOCRIT: 48.5 % (ref 39.0–52.0)
Hemoglobin: 15.8 g/dL (ref 13.0–17.0)
LYMPHS PCT: 16 % (ref 12–46)
Lymphs Abs: 1.1 10*3/uL (ref 0.7–4.0)
MCH: 30.7 pg (ref 26.0–34.0)
MCHC: 32.6 g/dL (ref 30.0–36.0)
MCV: 94.4 fL (ref 78.0–100.0)
MONO ABS: 0.4 10*3/uL (ref 0.1–1.0)
MONOS PCT: 6 % (ref 3–12)
Neutro Abs: 5.4 10*3/uL (ref 1.7–7.7)
Neutrophils Relative %: 77 % (ref 43–77)
Platelets: 181 10*3/uL (ref 150–400)
RBC: 5.14 MIL/uL (ref 4.22–5.81)
RDW: 16.1 % — ABNORMAL HIGH (ref 11.5–15.5)
WBC: 7 10*3/uL (ref 4.0–10.5)

## 2014-02-26 LAB — COMPREHENSIVE METABOLIC PANEL
ALT: 10 U/L (ref 0–53)
AST: 61 U/L — ABNORMAL HIGH (ref 0–37)
Albumin: 3.4 g/dL — ABNORMAL LOW (ref 3.5–5.2)
Alkaline Phosphatase: 76 U/L (ref 39–117)
BUN: 90 mg/dL — AB (ref 6–23)
CALCIUM: 10.1 mg/dL (ref 8.4–10.5)
CO2: 28 meq/L (ref 19–32)
CREATININE: 1.63 mg/dL — AB (ref 0.50–1.35)
Chloride: 126 mEq/L — ABNORMAL HIGH (ref 96–112)
GFR, EST AFRICAN AMERICAN: 43 mL/min — AB (ref >90)
GFR, EST NON AFRICAN AMERICAN: 37 mL/min — AB (ref >90)
GLUCOSE: 93 mg/dL (ref 70–99)
Potassium: 4.2 mEq/L (ref 3.7–5.3)
Sodium: 169 mEq/L (ref 137–147)
Total Bilirubin: 0.8 mg/dL (ref 0.3–1.2)
Total Protein: 7.8 g/dL (ref 6.0–8.3)

## 2014-02-26 LAB — URINALYSIS, ROUTINE W REFLEX MICROSCOPIC
Glucose, UA: NEGATIVE mg/dL
Hgb urine dipstick: NEGATIVE
KETONES UR: 15 mg/dL — AB
Nitrite: POSITIVE — AB
PROTEIN: NEGATIVE mg/dL
Specific Gravity, Urine: 1.033 — ABNORMAL HIGH (ref 1.005–1.030)
Urobilinogen, UA: 1 mg/dL (ref 0.0–1.0)
pH: 5 (ref 5.0–8.0)

## 2014-02-26 LAB — URINE MICROSCOPIC-ADD ON

## 2014-02-26 LAB — TROPONIN I: Troponin I: <0.3 ng/mL (ref <0.30)

## 2014-02-26 LAB — I-STAT CG4 LACTIC ACID, ED: Lactic Acid, Venous: 3.01 mmol/L — ABNORMAL HIGH (ref 0.5–2.2)

## 2014-02-26 LAB — PRO B NATRIURETIC PEPTIDE: Pro B Natriuretic peptide (BNP): 667.2 pg/mL — ABNORMAL HIGH (ref 0–450)

## 2014-02-26 MED ORDER — LORAZEPAM 2 MG/ML IJ SOLN
1.0000 mg | INTRAMUSCULAR | Status: DC | PRN
  Administered 2014-02-26: 1 mg via INTRAVENOUS
  Filled 2014-02-26: qty 1

## 2014-02-26 MED ORDER — SODIUM CHLORIDE 0.9 % IV BOLUS (SEPSIS)
1000.0000 mL | Freq: Once | INTRAVENOUS | Status: AC
  Administered 2014-02-26: 1000 mL via INTRAVENOUS

## 2014-02-26 MED ORDER — VANCOMYCIN HCL IN DEXTROSE 1-5 GM/200ML-% IV SOLN
1000.0000 mg | Freq: Once | INTRAVENOUS | Status: AC
  Administered 2014-02-26: 1000 mg via INTRAVENOUS
  Filled 2014-02-26: qty 200

## 2014-02-26 MED ORDER — VANCOMYCIN HCL IN DEXTROSE 1-5 GM/200ML-% IV SOLN: 1000.0000 mg | INTRAVENOUS | Status: DC

## 2014-02-26 MED ORDER — SODIUM CHLORIDE 0.9 % IV SOLN
INTRAVENOUS | Status: DC
  Administered 2014-02-26 (×2): via INTRAVENOUS

## 2014-02-26 MED ORDER — SCOPOLAMINE 1 MG/3DAYS TD PT72
1.0000 | MEDICATED_PATCH | TRANSDERMAL | Status: DC
  Administered 2014-02-26: 1.5 mg via TRANSDERMAL
  Filled 2014-02-26: qty 1

## 2014-02-26 MED ORDER — MORPHINE SULFATE 2 MG/ML IJ SOLN
2.0000 mg | Freq: Once | INTRAMUSCULAR | Status: AC
  Administered 2014-02-26: 2 mg via INTRAVENOUS

## 2014-02-26 MED ORDER — LORAZEPAM 2 MG/ML IJ SOLN: 0.5000 mg | Freq: Four times a day (QID) | INTRAMUSCULAR | Status: DC | PRN

## 2014-02-26 MED ORDER — MORPHINE SULFATE 2 MG/ML IJ SOLN
INTRAMUSCULAR | Status: AC
  Filled 2014-02-26: qty 1

## 2014-02-26 MED ORDER — PIPERACILLIN-TAZOBACTAM 3.375 G IVPB
3.3750 g | Freq: Three times a day (TID) | INTRAVENOUS | Status: DC
  Administered 2014-02-26: 3.375 g via INTRAVENOUS
  Filled 2014-02-26 (×2): qty 50

## 2014-02-26 MED ORDER — SODIUM CHLORIDE 0.9 % IV SOLN
INTRAVENOUS | Status: AC
  Administered 2014-02-26: 04:00:00 via INTRAVENOUS

## 2014-02-26 MED ORDER — SODIUM CHLORIDE 0.9 % IV SOLN
1.0000 mg/h | INTRAVENOUS | Status: DC
  Administered 2014-02-26 (×2): 1 mg/h via INTRAVENOUS
  Filled 2014-02-26: qty 10

## 2014-02-26 MED ORDER — PIPERACILLIN-TAZOBACTAM 3.375 G IVPB 30 MIN
3.3750 g | Freq: Once | INTRAVENOUS | Status: AC
  Administered 2014-02-26: 3.375 g via INTRAVENOUS
  Filled 2014-02-26: qty 50

## 2014-02-26 NOTE — Consult Note (Signed)
I have reviewed this case with our NP and agree with the Assessment and Plan as stated.  Afua Hoots L. Corvette Orser, MD MBA The Palliative Medicine Team at [facility] Team Phone: [phone] Pager: [phone]   

## 2014-02-26 NOTE — Progress Notes (Signed)
Patient admitted this AM by Dr. Alcario Drought.  No family at bedside.  Per records patient is actively dying- on morphine drip.  DNR- will add IV ativan PRN and consult palliative care- still on IVF and abx per admitting doctor ? hospital death   Eulogio Bear DO

## 2014-02-26 NOTE — Consult Note (Signed)
Patient Dale Wong      DOB: 02-Oct-1927      TKZ:[id number]     Consult Note from the Palliative Medicine Team at [place] Requested by: Dr Eliseo Squires     PCP: Kathlene November, MD Reason for Consultation:Clarification of Dale Wong and options     Phone Number:[phone]  Assessment of patients Current state:Dale Wong [place] is a 80 y.o. male who is brought in from his SNF in respiratory distress. Patient has clearly had an aspiration event and still has food stuff in his mouth. He is unable to provide any history.   [name] is present at bedside who is also patients partner of 45 yrs and HPOA.  Reported  progressive decline in the SNF since admission late last year. He had gotten to the point where she states his quality of life was essentially "nothing". This week he had been suffering from zoster on his head.   Decision  is for comfort, quality and dignity.   This NP Wadie Lessen reviewed medical records, received report from team, assessed the patient and then meet at the patient's bedside along with his partner [name] # [phone]  to discuss diagnosis prognosis, GOC, EOL wishes disposition and options.   A detailed discussion was had today regarding advanced directives.  Concepts specific to code status, artifical feeding and hydration, continued IV antibiotics and rehospitalization was had.  The difference between a aggressive medical intervention path  and a palliative comfort care path for this patient at this time was had.  Values and goals of care important to patient and family were attempted to be elicited.  Concept of Hospice and Palliative Care were discussed  Natural trajectory and expectations at EOL were discussed.  Questions and concerns addressed.  Family encouraged to call with questions or concerns.  PMT will continue to support holistically.   Goals of Care: 1.  Code Status:  DNR/DNI-comfort is main focus of care   2. Scope of Treatment: 1. Vital Signs:  daily  2. Respiratory/Oxygen: for comfort only 3. Nutritional Support/Tube Feeds:no artifical feeding now or in the future 4. Antibiotics: none 5. Blood Products:none 6. IVF:KVO for meds only 7. Review of Medications to be discontinued: minimize for comfort 8. Labs:none 9. Telemetry:none 10. Consults:none   4. Disposition: Prognosis is likely hrs to days, expect hospital death.  Family would be open to in patient hospice if it makes sense, will evaluate in the morning   3. Symptom Management:   1. Anxiety/Agitation: Ativan 1 mg IV every 4 hrs prn 2. Pain/Dyspnea: Morphine gtt, titrate to comfort 3. Terminal Secretions: Scopolamine patch as directed  4. Psychosocial:  Emotional support offered to [name] at bedside.  She speaks to 20 yrs of happy memories, and her hope for comfort for "her friend" at this known time of end of life.  5. Spiritual:  [employer] support    Brief RKY:HCWCB [name] is a 80 y.o. male who is brought in from his SNF in respiratory distress. Patient has clearly had an aspiration event and still has food stuff in his mouth. He is unable to provide any history.  [name] is present at bedside who is also patients POA. Per her patient has had progressive decline in the SNF since admission late last year. He had gotten to the point where she states his quality of life was essentially "nothing". This week he had been suffering from zoster on his head.   Focus  is comfort,prognosis is likely hrs to days    ROS: unable to illicit due to decreased cognition   PMH:  Past Medical History  Diagnosis Date  . Hyperlipidemia   . Fatigue   . Parkinson disease     Sees neurology in [place]  . Depression   . Dementia     sees neurology in [place]  . Alcohol abuse   . Colon cancer     Last colonoscopy November 2011,  . Colon cancer   . Hyperglycemia   . Fall      PSH: Past Surgical History  Procedure Laterality Date  . Spinal fusion      back  surgery for spinal stenosis  . Shoulder arthroscopy  4/11    s/p  . Colectomy     I have reviewed the FH and SH and  If appropriate update it with new information. No Known Allergies Scheduled Meds: . sodium chloride   Intravenous STAT  . scopolamine  1 patch Transdermal Q72H   Continuous Infusions: . sodium chloride 75 mL/hr at 02/26/14 0821  . morphine 1 mg/hr (02/26/14 0526)   PRN Meds:.LORazepam    BP 80/52  Pulse 80  Temp(Src) 100.7 F (38.2 C) (Rectal)  Resp 37  SpO2 96%   PPS:10 %  No intake or output data in the 24 hours ending 02/26/14 0957 LBM:PTA                        Physical Exam:  General: Transitioning at EOL, NAD HEENT:  Dry buccal membranes, no exudate, audible throat secretions Chest:   Decreased in bases, scattered coarse BS CVS: RRR Abdomen:soft NT decreased BS Ext: without edema, warm no mottling Neuro: unresponsive to gentle touch and verbal stimuli  Labs: CBC    Component Value Date/Time   WBC 7.0 02/26/2014 0237   RBC 5.14 02/26/2014 0237   HGB 15.8 02/26/2014 0237   HCT 48.5 02/26/2014 0237   PLT 181 02/26/2014 0237   MCV 94.4 02/26/2014 0237   MCH 30.7 02/26/2014 0237   MCHC 32.6 02/26/2014 0237   RDW 16.1* 02/26/2014 0237   LYMPHSABS 1.1 02/26/2014 0237   MONOABS 0.4 02/26/2014 0237   EOSABS 0.0 02/26/2014 0237   BASOSABS 0.0 02/26/2014 0237    BMET    Component Value Date/Time   NA 169* 02/26/2014 0237   K 4.2 02/26/2014 0237   CL 126* 02/26/2014 0237   CO2 28 02/26/2014 0237   GLUCOSE 93 02/26/2014 0237   GLUCOSE 96 12/13/2006 1129   BUN 90* 02/26/2014 0237   CREATININE 1.63* 02/26/2014 0237   CREATININE 0.96 06/28/2012 1443   CALCIUM 10.1 02/26/2014 0237   GFRNONAA 37* 02/26/2014 0237   GFRAA 43* 02/26/2014 0237    CMP     Component Value Date/Time   NA 169* 02/26/2014 0237   K 4.2 02/26/2014 0237   CL 126* 02/26/2014 0237   CO2 28 02/26/2014 0237   GLUCOSE 93 02/26/2014 0237   GLUCOSE 96 12/13/2006 1129   BUN 90* 02/26/2014 0237   CREATININE 1.63* 02/26/2014  0237   CREATININE 0.96 06/28/2012 1443   CALCIUM 10.1 02/26/2014 0237   PROT 7.8 02/26/2014 0237   ALBUMIN 3.4* 02/26/2014 0237   AST 61* 02/26/2014 0237   ALT 10 02/26/2014 0237   ALKPHOS 76 02/26/2014 0237   BILITOT 0.8 02/26/2014 0237   GFRNONAA 37* 02/26/2014 0237   GFRAA 43* 02/26/2014 0237      Time  In Time Out Total Time Spent with Patient Total Overall Time  0900 1015 70 min 75 min    Greater than 50%  of this time was spent counseling and coordinating care related to the above assessment and plan.  Discussed with Dr Madaline Brilliant NP  Palliative Medicine Team Team Phone # [phone] Pager [phone]

## 2014-02-26 NOTE — Progress Notes (Signed)
UR complete.  Damin Salido RN, MSN 

## 2014-02-26 NOTE — Progress Notes (Signed)
ANTIBIOTIC CONSULT NOTE - INITIAL  Pharmacy Consult for Vancocin and Zosyn Indication: rule out pneumonia  No Known Allergies  Patient Measurements: Weight: ~75kg  Vital Signs: Temp: 100.7 F (38.2 C) (03/05 0222) Temp src: Rectal (03/05 0222) BP: 96/57 mmHg (03/05 0345) Pulse Rate: 94 (03/05 0345)  Labs:  Recent Labs  02/26/14 0237  WBC 7.0  HGB 15.8  PLT 181  CREATININE 1.63*    Microbiology: No results found for this or any previous visit (from the past 720 hour(s)).  Medical History: Past Medical History  Diagnosis Date  . Hyperlipidemia   . Fatigue   . Parkinson disease     Sees neurology in [place]  . Depression   . Dementia     sees neurology in [place]  . Alcohol abuse   . Colon cancer     Last colonoscopy November 2011,  . Colon cancer   . Hyperglycemia   . Fall      Assessment: 80yo male presents from SNF in respiratory distress w/ EMS bagging pt on arrival, now more comfortable on nonrebreather w/ O2 to mid-90s, CXR negative, to begin IV ABX empirically; baseline SCr <1, now 1.63.  Goal of Therapy:  Vancomycin trough level 15-20 mcg/ml  Plan:  Rec'd vanc 1g and Zosyn 3.375g IV in ED; will continue with vancomycin 1000mg  IV Q24H and Zosyn 3.375g IV Q8H and monitor CBC, Cx, CrCl, levels prn.  Wynona Neat, PharmD, BCPS  02/26/2014,4:54 AM

## 2014-02-26 NOTE — ED Notes (Signed)
Arrives via EMS from [facility] care facility. Patient found to be in respiratory distress upon new staff arrival this evening at nursing home. EMS was assisting respiration upon arrival via bag valve mask starting around 0145.

## 2014-02-26 NOTE — Progress Notes (Signed)
Nutrition Brief Note  Patient identified due to Low Braden score   Wt Readings from Last 15 Encounters:  09/29/13 169 lb (76.658 kg)  08/11/13 186 lb (84.369 kg)  12/11/12 200 lb 12.8 oz (91.082 kg)  11/05/12 215 lb (97.523 kg)  11/04/12 215 lb (97.523 kg)  10/22/12 215 lb (97.523 kg)  10/18/12 215 lb (97.523 kg)  09/23/12 228 lb (103.42 kg)  08/23/12 222 lb (100.699 kg)  06/28/12 219 lb (99.338 kg)  05/24/12 227 lb 9.6 oz (103.239 kg)  05/18/12 226 lb (102.513 kg)  05/01/12 232 lb (105.235 kg)  05/12/11 224 lb (101.606 kg)  09/26/10 230 lb (104.327 kg)    There is no weight on file to calculate BMI.   Pt is currently NPO. Per chart, pt is endo of life care- majority of interventions focused on comfort.  "Unfortunately patient is essentially actively dying at this point".    Labs and medications reviewed.   No nutrition interventions warranted at this time. If nutrition issues arise, please consult RD.   Pryor Ochoa RD, LDN Inpatient Clinical Dietitian Pager: [phone] After Hours Pager: [phone]

## 2014-02-26 NOTE — H&P (Signed)
[facility] History and Physical  TEREL BANN EVO:[id number] DOB: 1927/06/10 DOA: 02/26/2014  Referring physician: EDP PCP: Kathlene November, MD   Chief Complaint: Respiratory distress   HPI: Dale Wong is a 80 y.o. male who is brought in from his SNF in respiratory distress.  Patient has clearly had an aspiration event and still has food stuff in his mouth.  He is unable to provide any history.  Dale Wong is present at bedside who is also patients POA.  Per her patient has had progressive decline in the SNF since admission late last year.  He had gotten to the point where she states his quality of life was essentially "nothing".  This week he had been suffering from zoster on his head.  Review of Systems: Systems reviewed.  As above, otherwise negative  Past Medical History  Diagnosis Date  . Hyperlipidemia   . Fatigue   . Parkinson disease     Sees neurology in [place]  . Depression   . Dementia     sees neurology in [place]  . Alcohol abuse   . Colon cancer     Last colonoscopy November 2011,  . Colon cancer   . Hyperglycemia   . Fall    Past Surgical History  Procedure Laterality Date  . Spinal fusion      back surgery for spinal stenosis  . Shoulder arthroscopy  4/11    s/p  . Colectomy     Social History:  reports that he has never smoked. He has never used smokeless tobacco. He reports that he does not drink alcohol or use illicit drugs.  No Known Allergies  Family History  Problem Relation Age of Onset  . Liver cancer Brother   . Diabetes Neg Hx   . Coronary artery disease Neg Hx      Prior to Admission medications   Medication Sig Start Date End Date Taking? Authorizing Provider  acetaminophen (TYLENOL) 500 MG tablet Take 1,000 mg by mouth every 8 (eight) hours as needed for pain.   Yes Historical Provider, MD  aspirin EC 81 MG tablet Take 81 mg by mouth at bedtime.    Yes Historical Provider, MD  calcium-vitamin D (OSCAL WITH D) 500-200 MG-UNIT per tablet  Take 1 tablet by mouth daily with breakfast.    Yes Historical Provider, MD  carbidopa-levodopa (PARCOPA) 25-250 MG per disintegrating tablet Take 1.5 tablets by mouth 4 (four) times daily.    Yes Historical Provider, MD  citalopram (CELEXA) 20 MG tablet Take 20 mg by mouth daily.   Yes Historical Provider, MD  cyanocobalamin 500 MCG tablet Take 500 mcg by mouth every morning.   Yes Historical Provider, MD  entacapone (COMTAN) 200 MG tablet Take 200 mg by mouth 4 (four) times daily.    Yes Historical Provider, MD  fish oil-omega-3 fatty acids 1000 MG capsule Take 1 g by mouth daily.    Yes Historical Provider, MD  LORazepam (ATIVAN) 1 MG tablet Take 1 tablet (1 mg total) by mouth at bedtime. 08/27/13  Yes Colon Branch, MD  Memantine HCl ER (NAMENDA XR) 28 MG CP24 Take 28 mg by mouth daily.    Yes Historical Provider, MD  Multiple Vitamins-Minerals (CENTRUM SILVER PO) Take 1 tablet by mouth daily.   Yes Historical Provider, MD  Rivastigmine (EXELON) 13.3 MG/24HR PT24 Place 13.3 mg onto the skin daily.   Yes Historical Provider, MD  rotigotine (NEUPRO) 6 MG/24HR Place 1 patch onto the skin daily.  Yes Historical Provider, MD  simvastatin (ZOCOR) 20 MG tablet TAKE 1 TABLET BY MOUTH AT BEDTIME 12/14/13  Yes Colon Branch, MD  vitamin C (ASCORBIC ACID) 500 MG tablet Take 500 mg by mouth daily.    Yes Historical Provider, MD  nystatin-triamcinolone (MYCOLOG II) cream Apply 1 application topically 4 (four) times daily as needed. 08/11/13   Colon Branch, MD  QUEtiapine (SEROQUEL) 25 MG tablet Take 25 mg by mouth at bedtime.    Historical Provider, MD  rOPINIRole (REQUIP) 3 MG tablet Take 3 mg by mouth 3 (three) times daily.     Historical Provider, MD  triamcinolone cream (KENALOG) 0.1 % Apply 1 application topically 2 (two) times daily as needed (itching/rash).    Historical Provider, MD   Physical Exam: Filed Vitals:   02/26/14 0345  BP: 96/57  Pulse: 94  Temp:   Resp: 24    BP 96/57  Pulse 94   Temp(Src) 100.7 F (38.2 C) (Rectal)  Resp 24  SpO2 97%  General Appearance:    Respiratory distress, obtunded, appears stated age  Head:    Normocephalic, atraumatic, has herpes zoster on his forehead  Eyes:    PERRL, EOMI, sclera non-icteric        Nose:   Nares without drainage or epistaxis. Mucosa, turbinates normal  Throat:   Soft foods noted about his mouth  Neck:   Supple. No carotid bruits.  No thyromegaly.  No lymphadenopathy.   Back:     No CVA tenderness, no spinal tenderness  Lungs:     Diffuse rhonchi, increased respiratory effort  Chest wall:    No tenderness to palpitation  Heart:    Regular rate and rhythm without murmurs, gallops, rubs  Abdomen:     Soft, non-tender, nondistended, normal bowel sounds, no organomegaly  Genitalia:    deferred  Rectal:    deferred  Extremities:   Contractures in all extremities  Pulses:   2+ and symmetric all extremities  Skin:   Skin color, texture, turgor normal, no rashes or lesions  Lymph nodes:   Cervical, supraclavicular, and axillary nodes normal  Neurologic:   Not following commands, cogwheel rigidity, MAE    Labs on Admission:  Basic Metabolic Panel:  Recent Labs Lab 02/26/14 0237  NA 169*  K 4.2  CL 126*  CO2 28  GLUCOSE 93  BUN 90*  CREATININE 1.63*  CALCIUM 10.1   Liver Function Tests:  Recent Labs Lab 02/26/14 0237  AST 61*  ALT 10  ALKPHOS 76  BILITOT 0.8  PROT 7.8  ALBUMIN 3.4*   No results found for this basename: LIPASE, AMYLASE,  in the last 168 hours No results found for this basename: AMMONIA,  in the last 168 hours CBC:  Recent Labs Lab 02/26/14 0237  WBC 7.0  NEUTROABS 5.4  HGB 15.8  HCT 48.5  MCV 94.4  PLT 181   Cardiac Enzymes:  Recent Labs Lab 02/26/14 0237  TROPONINI <0.30    BNP (last 3 results)  Recent Labs  02/26/14 0237  PROBNP 667.2*   CBG: No results found for this basename: GLUCAP,  in the last 168 hours  Radiological Exams on Admission: Dg Chest Port  1 View  02/26/2014   CLINICAL DATA:  Shortness of breath  EXAM: PORTABLE CHEST - 1 VIEW  COMPARISON:  Prior radiograph from 11/03/2013  FINDINGS: Mild cardiomegaly is stable as compared to prior exam. Atherosclerotic calcifications noted within the aortic arch.  The lungs are  normally inflated. No airspace consolidation, pleural effusion, or pulmonary edema is identified. There is no pneumothorax.  No acute osseous abnormality identified.  IMPRESSION: No active cardiopulmonary process.   Electronically Signed   By: Jeannine Boga M.D.   On: 02/26/2014 03:32    EKG: Independently reviewed.  Assessment/Plan Principal Problem:   Respiratory distress Active Problems:   End of life care   Aspiration pneumonia   1. Respiratory distress due to aspiration PNA - will treat with zosyn and vanc; however, as discussed with family this is almost certianly doomed to fail without extreme measures that patient would not want (intubation, etc).  As a result we will focus the majority of our interventions on comfort rather than curative measures in this patient who is essentially actively dying at this point.  Have given IVF in ED and will continue these for his severe hypernatremia of 169.  Hypernatremia likely due to severe dehydration from poor PO intake (as described by POA).  Other medical issues include pre-renal AKI. 2. End of life care - morphine gtt ordered to titrate to comfort for air hunger.  Likely to get palliative care consult later this morning if patient survives that long.  Unfortunately patient is essentially actively dying at this point with respirations in the upper 40s, obtunded, not very responsive.  This despite a NRB and he is satting in the low 90s.  Had this discussion with POA who understands.  Patients baseline QoL before today's acute illness was already best described as "nothing" per his POA, and certainly his medical records seem to support this.  Have had long discussion with POA,  all questions answered.    Code Status: DNR/DNI discussed with POA at bedside, will continue ABX  Family Communication: POA is at bedside Disposition Plan: Admit to inpatient, anticipate hospital death, likely within a few hours.   Time spent: 70 min  GARDNER, JARED M. [facility] Pager [phone]  If 7AM-7PM, please contact the day team taking care of the patient [email or web address] Password [email or web address] 02/26/2014, 4:55 AM

## 2014-02-26 NOTE — ED Provider Notes (Signed)
CSN: [id number]     Arrival date & time 02/26/14  0203 History   First MD Initiated Contact with Patient 02/26/14 0206     Chief Complaint  Patient presents with  . Respiratory Distress     (Consider location/radiation/quality/duration/timing/severity/associated sxs/prior Treatment) HPI Patient is in 80 year old gentleman with Parkinson's and dementia who presents from nursing home for acute respiratory distress noted this evening by staff. Patient is nonverbal currently and unable to contribute history. Level V caveat applies. The patient arrives via EMS with a DO NOT RESUSCITATE form. Paramedics bagging as the patient enters the emergency department. Past Medical History  Diagnosis Date  . Hyperlipidemia   . Fatigue   . Parkinson disease     Sees neurology in [place]  . Depression   . Dementia     sees neurology in [place]  . Alcohol abuse   . Colon cancer     Last colonoscopy November 2011,  . Colon cancer   . Hyperglycemia   . Fall    Past Surgical History  Procedure Laterality Date  . Spinal fusion      back surgery for spinal stenosis  . Shoulder arthroscopy  4/11    s/p  . Colectomy     Family History  Problem Relation Age of Onset  . Liver cancer Brother   . Diabetes Neg Hx   . Coronary artery disease Neg Hx    History  Substance Use Topics  . Smoking status: Never Smoker   . Smokeless tobacco: Never Used  . Alcohol Use: No    Review of Systems  Unable to perform ROS: Dementia      Allergies  Review of patient's allergies indicates no known allergies.  Home Medications   Current Outpatient Rx  Name  Route  Sig  Dispense  Refill  . acetaminophen (TYLENOL) 500 MG tablet   Oral   Take 1,000 mg by mouth every 8 (eight) hours as needed for pain.         Marland Kitchen aspirin EC 81 MG tablet   Oral   Take 81 mg by mouth at bedtime.          . calcium-vitamin D (OSCAL WITH D) 500-200 MG-UNIT per tablet   Oral   Take 1 tablet by mouth daily with breakfast.         . carbidopa-levodopa (PARCOPA) 25-250 MG per disintegrating tablet   Oral   Take 1.5 tablets by mouth 4 (four) times daily.          . citalopram (CELEXA) 20 MG tablet   Oral   Take 20 mg by mouth daily.         . cyanocobalamin 500 MCG tablet   Oral   Take 500 mcg by mouth every morning.         . entacapone (COMTAN) 200 MG tablet   Oral   Take 200 mg by mouth 4 (four) times daily.          . fish oil-omega-3 fatty acids 1000 MG capsule   Oral   Take 1 g by mouth daily.          Marland Kitchen LORazepam (ATIVAN) 1 MG tablet   Oral   Take 1 tablet (1 mg total) by mouth at bedtime.   30 tablet   3   . Memantine HCl ER (NAMENDA XR) 28 MG CP24   Oral   Take 28 mg by mouth daily.          Marland Kitchen  Multiple Vitamins-Minerals (CENTRUM SILVER PO)   Oral   Take 1 tablet by mouth daily.         Marland Kitchen nystatin-triamcinolone (MYCOLOG II) cream   Topical   Apply 1 application topically 4 (four) times daily as needed.         Marland Kitchen QUEtiapine (SEROQUEL) 25 MG tablet   Oral   Take 25 mg by mouth at bedtime.         . Rivastigmine (EXELON) 13.3 MG/24HR PT24   Transdermal   Place 13.3 mg onto the skin daily.         Marland Kitchen rOPINIRole (REQUIP) 3 MG tablet   Oral   Take 3 mg by mouth 3 (three) times daily.          . simvastatin (ZOCOR) 20 MG tablet      TAKE 1 TABLET BY MOUTH AT BEDTIME   30 tablet   5   . triamcinolone cream (KENALOG) 0.1 %   Topical   Apply 1 application topically 2 (two) times daily as needed (itching/rash).         . vitamin C (ASCORBIC ACID) 500 MG tablet   Oral   Take 500 mg by mouth daily.          Marland Kitchen zolpidem (AMBIEN) 10 MG tablet   Oral   Take by mouth at bedtime as needed for sleep.           Temp(Src) 100.7 F (38.2 C) (Rectal)  SpO2 % Physical Exam  Nursing note and vitals reviewed. Constitutional: He appears well-developed. He appears distressed.  Patient appears chronically ill. He is contracted all extremities. He has not  following commands or answer questions.  HENT:  Head: Normocephalic and atraumatic.  Soft foods noted in the mouth and on the lips.  Eyes: EOM are normal. Pupils are equal, round, and reactive to light.  Neck: Normal range of motion. Neck supple.  Cardiovascular: Normal rate and regular rhythm.   Pulmonary/Chest: He is in respiratory distress. He has no wheezes. He has no rales.  Increased respiratory effort with diffuse rhonchi.  Abdominal: Soft. Bowel sounds are normal. He exhibits no distension and no mass. There is no tenderness. There is no rebound and no guarding.  Musculoskeletal: Normal range of motion. He exhibits no edema and no tenderness.  Contracted extremities with cogwheel rigidity. Extremities are also cool and reticulated. No definite calf swelling or tenderness.  Neurological:  Patient not following commands. He appears to be moving all his extremities.  Skin: Skin is warm and dry. No rash noted. No erythema.    ED Course  Procedures (including critical care time) Labs Review Labs Reviewed  CBC WITH DIFFERENTIAL - Abnormal; Notable for the following:    RDW 16.1 (*)    All other components within normal limits  CULTURE, BLOOD (ROUTINE X 2)  CULTURE, BLOOD (ROUTINE X 2)  COMPREHENSIVE METABOLIC PANEL  PRO B NATRIURETIC PEPTIDE  TROPONIN I  URINALYSIS, ROUTINE W REFLEX MICROSCOPIC  I-STAT CG4 LACTIC ACID, ED   Imaging Review No results found.   EKG Interpretation   Date/Time:  Thursday February 26 2014 02:06:11 EST Ventricular Rate:  105 PR Interval:  145 QRS Duration: 84 QT Interval:  324 QTC Calculation: 428 R Axis:   66 Text Interpretation:  Age not entered, assumed to be  80 years old for  purpose of ECG interpretation Sinus tachycardia Repol abnrm, severe global  ischemia (LM/MVD) Confirmed by Lita Mains  MD, Lonnetta Kniskern ([id number]) on 02/26/2014  4:20:44 AM     CRITICAL CARE Performed by: Lita Mains, Caitlynne Harbeck Total critical care time: 30 min Critical care time  was exclusive of separately billable procedures and treating other patients. Critical care was necessary to treat or prevent imminent or life-threatening deterioration. Critical care was time spent personally by me on the following activities: development of treatment plan with patient and/or surrogate as well as nursing, discussions with consultants, evaluation of patient's response to treatment, examination of patient, obtaining history from patient or surrogate, ordering and performing treatments and interventions, ordering and review of laboratory studies, ordering and review of radiographic studies, pulse oximetry and re-evaluation of patient's condition.  MDM   Final diagnoses:  None    Discussed with the patient power of attorney, [name]. Discuss the critical nature of the patient's condition. She agreed that no heroic measures should be made including intubation and cardiac resuscitation. She agrees with supportive measures of IV fluids, supplemental oxygen, antibiotics and pain control.  Patient appears more comfortable. He is maintaining saturations in the mid 90s on a nonrebreather. Started IV fluids broad-spectrum antibiotics. The patient's power of attorney is at bedside. Also talk with the hospitalist Dr. Alcario Drought. He is actually seeing the patient and will admit.  Julianne Rice, MD 02/26/14 [phone]

## 2014-02-26 NOTE — Plan of Care (Signed)
Charge nurse Tracey and pt's nurse assessed pt lesions on forehead. Crusted and brown. No active blisters or weeping noted. No lesions noted on back, abd, or anywhere parts of the body besides the forehead. Airborne precautions taken off of pt and MD is aware.

## 2014-02-27 ENCOUNTER — Inpatient Hospital Stay (HOSPITAL_COMMUNITY)
Admission: AD | Admit: 2014-02-27 | Discharge: 2014-03-25 | DRG: 177 | Disposition: E | Source: Hospice | Attending: Internal Medicine | Admitting: Internal Medicine

## 2014-02-27 DIAGNOSIS — Z515 Encounter for palliative care: Secondary | ICD-10-CM

## 2014-02-27 DIAGNOSIS — W19XXXA Unspecified fall, initial encounter: Secondary | ICD-10-CM

## 2014-02-27 DIAGNOSIS — Z66 Do not resuscitate: Secondary | ICD-10-CM | POA: Diagnosis present

## 2014-02-27 DIAGNOSIS — R0682 Tachypnea, not elsewhere classified: Secondary | ICD-10-CM | POA: Diagnosis present

## 2014-02-27 DIAGNOSIS — E785 Hyperlipidemia, unspecified: Secondary | ICD-10-CM | POA: Diagnosis present

## 2014-02-27 DIAGNOSIS — J9600 Acute respiratory failure, unspecified whether with hypoxia or hypercapnia: Secondary | ICD-10-CM | POA: Diagnosis present

## 2014-02-27 DIAGNOSIS — J690 Pneumonitis due to inhalation of food and vomit: Principal | ICD-10-CM | POA: Diagnosis present

## 2014-02-27 DIAGNOSIS — G3183 Dementia with Lewy bodies: Secondary | ICD-10-CM

## 2014-02-27 DIAGNOSIS — Z85038 Personal history of other malignant neoplasm of large intestine: Secondary | ICD-10-CM

## 2014-02-27 DIAGNOSIS — F0280 Dementia in other diseases classified elsewhere without behavioral disturbance: Secondary | ICD-10-CM | POA: Diagnosis present

## 2014-02-27 MED ORDER — ACETAMINOPHEN 10 MG/ML IV SOLN
1000.0000 mg | Freq: Four times a day (QID) | INTRAVENOUS | Status: DC
  Administered 2014-02-27: 1000 mg via INTRAVENOUS
  Filled 2014-02-27 (×3): qty 100

## 2014-02-27 MED ORDER — SCOPOLAMINE 1 MG/3DAYS TD PT72: 1.0000 | MEDICATED_PATCH | TRANSDERMAL | Status: DC

## 2014-02-27 MED ORDER — LORAZEPAM 2 MG/ML IJ SOLN: 1.0000 mg | INTRAMUSCULAR | Status: DC | PRN

## 2014-02-27 MED ORDER — SODIUM CHLORIDE 0.9 % IV SOLN
1.0000 mg/h | INTRAVENOUS | Status: DC
  Filled 2014-02-27: qty 10

## 2014-03-04 LAB — CULTURE, BLOOD (ROUTINE X 2)
CULTURE: NO GROWTH
Culture: NO GROWTH

## 2014-03-25 NOTE — Progress Notes (Signed)
Request  to collaborate with [facility] and PMT for GIP evaluation.  Patient placed on Morphine gtt for acute symptom management with titration orders. PMT consulted for [place] completed, comfort and symptom management are goals.  Patient significant other who is also HPOA confirmed goals and is agreeable to evaluation for inpatient hospice services with [facility] as her choice.  Dr. Eliseo Squires will retain attending services if eligibility is confirmed.  Referral for GIP eligibility evaluation will be placed.    Kizzie Fantasia, RN, MSN, [facility] Palliative Integration

## 2014-03-25 NOTE — Discharge Summary (Signed)
Death Summary  Dale Wong [id number] DOB: Nov 09, 1927 DOA: March 14, 2014  PCP: Kathlene November, MD   Admit date: 03-14-14 Date of Death: 14-Mar-2014  Final Diagnoses:  Active Problems:   End of life care    Patient died at 6:36 on 2023/03/15. Death certificate filled out at time of discharge       Time: 20  Signed:  Eulogio Bear  [facility] 03-14-14, 6:59 PM

## 2014-03-25 NOTE — Progress Notes (Addendum)
Manus Gunning Joshau Code RN and Shara Blazing RN each listened to the lack of an apical pulse for one minute with a stethoscope. Pt pronounced dead at 1836. Dr Eliseo Squires and hospice notified. Family member at bedside.

## 2014-03-25 NOTE — H&P (Signed)
[facility] History and Physical  CLARION MOONEYHAN [id number] DOB: [birth year] DOA: 02/26/2014  Referring physician: er PCP: Kathlene November, MD   Chief Complaint: admit for GIP  HPI: Dale Wong is a 80 y.o. male  Who is being admitted for end of life care for GIP.  Patient is comfortable on current regimen.     Review of Systems:  Unable to do    Past Medical History  Diagnosis Date  . Hyperlipidemia   . Fatigue   . Parkinson disease     Sees neurology in [place]  . Depression   . Dementia     sees neurology in [place]  . Alcohol abuse   . Colon cancer     Last colonoscopy November 2011,  . Colon cancer   . Hyperglycemia   . Fall    Past Surgical History  Procedure Laterality Date  . Spinal fusion      back surgery for spinal stenosis  . Shoulder arthroscopy  4/11    s/p  . Colectomy     Social History:  reports that he has never smoked. He has never used smokeless tobacco. He reports that he does not drink alcohol or use illicit drugs.  No Known Allergies  Family History  Problem Relation Age of Onset  . Liver cancer Brother   . Diabetes Neg Hx   . Coronary artery disease Neg Hx      Prior to Admission medications   Not on File   Physical Exam: Filed Vitals:   Mar 03, 2014 1349  BP: 61/36  Pulse: 110  Temp: 103 F (39.4 C)  Resp: 21    BP 61/36  Pulse 110  Temp(Src) 103 F (39.4 C) (Axillary)  Resp 21  Ht 5' 10.87" (1.8 m)  Wt 67.949 kg (149 lb 12.8 oz)  BMI 20.97 kg/m2  SpO2 86%  General:  Eyes closed, dried herpetic rash on face Cardiovascular: tachy Respiratory: Coarse breath sound Abdomen: soft, ntnd           Labs on Admission:  Basic Metabolic Panel:  Recent Labs Lab 02/26/14 0237  NA 169*  K 4.2  CL 126*  CO2 28  GLUCOSE 93  BUN 90*  CREATININE 1.63*  CALCIUM 10.1   Liver Function Tests:  Recent Labs Lab 02/26/14 0237  AST 61*  ALT 10  ALKPHOS 76  BILITOT 0.8  PROT 7.8  ALBUMIN 3.4*   No results found for this  basename: LIPASE, AMYLASE,  in the last 168 hours No results found for this basename: AMMONIA,  in the last 168 hours CBC:  Recent Labs Lab 02/26/14 0237  WBC 7.0  NEUTROABS 5.4  HGB 15.8  HCT 48.5  MCV 94.4  PLT 181   Cardiac Enzymes:  Recent Labs Lab 02/26/14 0237  TROPONINI <0.30    BNP (last 3 results)  Recent Labs  02/26/14 0237  PROBNP 667.2*   CBG: No results found for this basename: GLUCAP,  in the last 168 hours  Radiological Exams on Admission: Dg Chest Port 1 View  02/26/2014   CLINICAL DATA:  Shortness of breath  EXAM: PORTABLE CHEST - 1 VIEW  COMPARISON:  Prior radiograph from 11/03/2013  FINDINGS: Mild cardiomegaly is stable as compared to prior exam. Atherosclerotic calcifications noted within the aortic arch.  The lungs are normally inflated. No airspace consolidation, pleural effusion, or pulmonary edema is identified. There is no pneumothorax.  No acute osseous abnormality identified.  IMPRESSION: No active cardiopulmonary process.  Electronically Signed   By: Jeannine Boga M.D.   On: 02/26/2014 03:32      Assessment/Plan Principal Problem:   Respiratory distress Active Problems:   End of life care   Aspiration pneumonia   Palliative care encounter   Dyspnea   Tachypnea   End of life care:  Morphine gtt, ativan PRN, scopolamine patch Acute resp failure- due to aspiration  hospice  Code Status: DNR Family Communication:  Disposition Plan:   Time spent: 44 min  Eulogio Bear [facility] Pager [phone]

## 2014-03-25 NOTE — H&P (Signed)
[facility] History and Physical  Dale Wong [id number] DOB: Jun 21, 1927 DOA: 03/18/14  Referring physician: er PCP: Kathlene November, MD   Chief Complaint: admit for GIP  HPI: Dale Wong is a 80 y.o. male  Who is being admitted for end of life care for GIP.  Patient is comfortable on current regimen.     Review of Systems:  Unable to do    Past Medical History  Diagnosis Date  . Hyperlipidemia   . Fatigue   . Parkinson disease     Se[place] neurology in [place]  . Depression   . Dementia     sees neurology in [place]  . Alcohol abuse   . Colon cancer     Last colonoscopy November 2011,  . Colon cancer   . Hyperglycemia   . Fall    Past Surgical History  Procedure Laterality Date  . Spinal fusion      back surgery for spinal stenosis  . Shoulder arthroscopy  4/11    s/p  . Colectomy     Social History:  reports that he has never smoked. He has never used smokeless tobacco. He reports that he does not drink alcohol or use illicit drugs.  No Known Allergies  Family History  Problem Relation Age of Onset  . Liver cancer Brother   . Diabetes Neg Hx   . Coronary artery disease Neg Hx      Prior to Admission medications   Not on File   Physical Exam: There were no vitals filed for this visit.  There were no vitals taken for this visit.  General:  Eyes closed, dried herpetic rash on face Cardiovascular: tachy Respiratory: Coarse breath sound Abdomen: soft, ntnd           Labs on Admission:  Basic Metabolic Panel:  Recent Labs Lab 02/26/14 0237  NA 169*  K 4.2  CL 126*  CO2 28  GLUCOSE 93  BUN 90*  CREATININE 1.63*  CALCIUM 10.1   Liver Function Tests:  Recent Labs Lab 02/26/14 0237  AST 61*  ALT 10  ALKPHOS 76  BILITOT 0.8  PROT 7.8  ALBUMIN 3.4*   No results found for this basename: LIPASE, AMYLASE,  in the last 168 hours No results found for this basename: AMMONIA,  in the last 168 hours CBC:  Recent Labs Lab 02/26/14 0237  WBC  7.0  NEUTROABS 5.4  HGB 15.8  HCT 48.5  MCV 94.4  PLT 181   Cardiac Enzymes:  Recent Labs Lab 02/26/14 0237  TROPONINI <0.30    BNP (last 3 results)  Recent Labs  02/26/14 0237  PROBNP 667.2*   CBG: No results found for this basename: GLUCAP,  in the last 168 hours  Radiological Exams on Admission: Dg Chest Port 1 View  02/26/2014   CLINICAL DATA:  Shortness of breath  EXAM: PORTABLE CHEST - 1 VIEW  COMPARISON:  Prior radiograph from 11/03/2013  FINDINGS: Mild cardiomegaly is stable as compared to prior exam. Atherosclerotic calcifications noted within the aortic arch.  The lungs are normally inflated. No airspace consolidation, pleural effusion, or pulmonary edema is identified. There is no pneumothorax.  No acute osseous abnormality identified.  IMPRESSION: No active cardiopulmonary process.   Electronically Signed   By: Jeannine Boga M.D.   On: 02/26/2014 03:32      Assessment/Plan Active Problems:   End of life care   End of life care:  Morphine gtt, ativan PRN, scopolamine patch Acute  resp failure- due to aspiration  hospice  Code Status: DNR Family Communication:  Disposition Plan:   Time spent: 64 min  Eulogio Bear [facility] Pager [phone]

## 2014-03-25 NOTE — Progress Notes (Signed)
Chaplain provided ministry of presence and emotional support to patient's friend. Chaplain extended friendship and care to patient's friend. Chaplain listened supportively as she shared their life story.   2014-03-12 1600  Clinical Encounter Type  Visited With Family  Visit Type Initial;Spiritual support;Social support;Patient actively dying  Referral From Nurse  Consult/Referral To Chaplain  Spiritual Encounters  Spiritual Needs Emotional;Grief support

## 2014-03-25 NOTE — Progress Notes (Signed)
Progress Note from the Palliative Medicine Team at [facility]:  -patient is unresponsive to gentle touch and verbal stimuli  -tachypneic, discussed titration of morphine with nurisng   - decreased urine output, noted mottling of knees  -prognosis is likely days, not stable for transport     Objective: No Known Allergies Scheduled Meds: . scopolamine  1 patch Transdermal Q72H   Continuous Infusions: . sodium chloride 10 mL/hr at 02/26/14 1010  . morphine 1 mg/hr (02/26/14 1015)   PRN Meds:.LORazepam  BP 71/41  Pulse 104  Temp(Src) 102.6 F (39.2 C) (Axillary)  Resp 18  Ht 5' 10.87" (1.8 m)  Wt 67.949 kg (149 lb 12.8 oz)  BMI 20.97 kg/m2  SpO2 99%   PPS: 20 % at best   No intake or output data in the 24 hours ending 03/18/2014 0950     Physical Exam:  General: Transitioning at EOL, prognosis is limited HEENT: Dry buccal membranes, no exudate,  Chest: Decreased in bases, scattered coarse BS  CVS: RRR  Abdomen:soft NT decreased BS  Ext: without edema,  Mottling of knees  Neuro: unresponsive to gentle touch and verbal stimuli    Labs: CBC    Component Value Date/Time   WBC 7.0 02/26/2014 0237   RBC 5.14 02/26/2014 0237   HGB 15.8 02/26/2014 0237   HCT 48.5 02/26/2014 0237   PLT 181 02/26/2014 0237   MCV 94.4 02/26/2014 0237   MCH 30.7 02/26/2014 0237   MCHC 32.6 02/26/2014 0237   RDW 16.1* 02/26/2014 0237   LYMPHSABS 1.1 02/26/2014 0237   MONOABS 0.4 02/26/2014 0237   EOSABS 0.0 02/26/2014 0237   BASOSABS 0.0 02/26/2014 0237    BMET    Component Value Date/Time   NA 169* 02/26/2014 0237   K 4.2 02/26/2014 0237   CL 126* 02/26/2014 0237   CO2 28 02/26/2014 0237   GLUCOSE 93 02/26/2014 0237   GLUCOSE 96 12/13/2006 1129   BUN 90* 02/26/2014 0237   CREATININE 1.63* 02/26/2014 0237   CREATININE 0.96 06/28/2012 1443   CALCIUM 10.1 02/26/2014 0237   GFRNONAA 37* 02/26/2014 0237   GFRAA 43* 02/26/2014 0237    CMP     Component Value Date/Time   NA 169* 02/26/2014 0237   K 4.2  02/26/2014 0237   CL 126* 02/26/2014 0237   CO2 28 02/26/2014 0237   GLUCOSE 93 02/26/2014 0237   GLUCOSE 96 12/13/2006 1129   BUN 90* 02/26/2014 0237   CREATININE 1.63* 02/26/2014 0237   CREATININE 0.96 06/28/2012 1443   CALCIUM 10.1 02/26/2014 0237   PROT 7.8 02/26/2014 0237   ALBUMIN 3.4* 02/26/2014 0237   AST 61* 02/26/2014 0237   ALT 10 02/26/2014 0237   ALKPHOS 76 02/26/2014 0237   BILITOT 0.8 02/26/2014 0237   GFRNONAA 37* 02/26/2014 0237   GFRAA 43* 02/26/2014 0237      Assessment and Plan: 1. Code Status:DNR/DNI-comfort is main focus of care 2. Symptom Control: 1. Anxiety/Agitation: Ativan 1 mg IV every 4 hrs prn 2. Pain/Dyspnea: Morphine gtt, titrate to comfort 3. Terminal Secretions: Scopolamine patch as directed  3. Psycho/Social:  Emotional support Dale Wong his life partner.  She understands the limited prognosis and her hope is for comfort and dignity 4. Spiritual   [employer] support 5. Disposition:  Expect a hospital death, GIP being investigated    Wadie Lessen NP  Palliative Medicine Team Team Phone # [phone] Pager [phone]  Discussed with Dr Eliseo Squires

## 2014-03-25 NOTE — Progress Notes (Signed)
Inpatient Rm [facility] RN Visit- M. Wynetta Emery, RN  GIP Related admission to [facility] diagnosis of Parkinson's Disease (ICD 332.0); DNR code status   Pt seen at bedside unresponsive to voice or touch, irregular RR=16 with 5-10 second periods of apnea, fingers,knees mottled skin warm to touch with elevated temperature. Appears comfortable, Morphine drip infusing at 2mg /hr Significant other Dale Wong, at bedside- she has completed [facility] consent forms and reviewed services with [facility]. Dale Wong shared she and 'Dale Wong' have been together for 20+ years and for the last several years he has declined and suffered with advancing Parkinson's Disease. He has been at [facility] home for almost a year and she has visited daily to help feed him lunch. Dale Wong shared memories,they loved to garden together. Dale Wong informed that Dale Wong has one living brother, in [place], Dale Wong has contacted him to inform of this decline. Dale Wong stated [name]'s brother is making final funeral arrangements and would request to use [employer] and [employer] at time of death.  Dale Wong was tearful, emotional support offered, Dale Wong voiced that she knows [name]'s time is veery short; she said she has spoken to her paster today and that was comforting and would like to have a hospital Chaplain visit for support and Probation officer contacted [place] office to request Chaplain   Dale Wong is aware [facility] will continue to follow along with Dr Eliseo Squires attending physician Please call [facility] @ [phone] with any hospice needs.   Thank you.  Danton Sewer, RN  [facility]  Hospice Liaison  [phone])

## 2014-03-25 NOTE — Progress Notes (Signed)
PROGRESS NOTE  Dale Wong ACZ:[id number] DOB: 1927/05/11 DOA: 02/26/2014 PCP: Kathlene November, MD  Assessment/Plan: Respiratory distress due to aspiration PNA - now comfort care End of life care - morphine gtt ordered to titrate to comfort for air hunger. Ativan, scopolamine patch. palliative care consult  Code Status: DNR Family Communication: no family at bedside Disposition Plan:    Consultants:  Palliative care  Procedures:    Antibiotics:    HPI/Subjective: Eyes closed, resting  Objective: Filed Vitals:   03-07-14 0115  BP: 71/41  Pulse: 104  Temp: 102.6 F (39.2 C)  Resp: 18   No intake or output data in the 24 hours ending 2014-03-07 0856 Filed Weights   02/26/14 1900  Weight: 67.949 kg (149 lb 12.8 oz)    Exam:   General:  Resting, no increased work of breathing but periods of apnea  Cardiovascular: regular   Data Reviewed: Basic Metabolic Panel:  Recent Labs Lab 02/26/14 0237  NA 169*  K 4.2  CL 126*  CO2 28  GLUCOSE 93  BUN 90*  CREATININE 1.63*  CALCIUM 10.1   Liver Function Tests:  Recent Labs Lab 02/26/14 0237  AST 61*  ALT 10  ALKPHOS 76  BILITOT 0.8  PROT 7.8  ALBUMIN 3.4*   No results found for this basename: LIPASE, AMYLASE,  in the last 168 hours No results found for this basename: AMMONIA,  in the last 168 hours CBC:  Recent Labs Lab 02/26/14 0237  WBC 7.0  NEUTROABS 5.4  HGB 15.8  HCT 48.5  MCV 94.4  PLT 181   Cardiac Enzymes:  Recent Labs Lab 02/26/14 0237  TROPONINI <0.30   BNP (last 3 results)  Recent Labs  02/26/14 0237  PROBNP 667.2*   CBG: No results found for this basename: GLUCAP,  in the last 168 hours  Recent Results (from the past 240 hour(s))  CULTURE, BLOOD (ROUTINE X 2)     Status: None   Collection Time    02/26/14  3:09 AM      Result Value Ref Range Status   Specimen Description BLOOD RIGHT ARM   Final   Special Requests     Final   Value: BOTTLES DRAWN AEROBIC AND  ANAEROBIC 10CC BLUE 5CC RED   Culture  Setup Time     Final   Value: 02/26/2014 08:43     Performed at [facility]   Culture     Final   Value:        BLOOD CULTURE RECEIVED NO GROWTH TO DATE CULTURE WILL BE HELD FOR 5 DAYS BEFORE ISSUING A FINAL NEGATIVE REPORT     Performed at [facility]   Report Status PENDING   Incomplete  CULTURE, BLOOD (ROUTINE X 2)     Status: None   Collection Time    02/26/14  3:16 AM      Result Value Ref Range Status   Specimen Description BLOOD LEFT FOREARM   Final   Special Requests BOTTLES DRAWN AEROBIC ONLY 5CC   Final   Culture  Setup Time     Final   Value: 02/26/2014 08:42     Performed at [facility]   Culture     Final   Value:        BLOOD CULTURE RECEIVED NO GROWTH TO DATE CULTURE WILL BE HELD FOR 5 DAYS BEFORE ISSUING A FINAL NEGATIVE REPORT     Performed at [facility]   Report Status  PENDING   Incomplete     Studies: Dg Chest Port 1 View  02/26/2014   CLINICAL DATA:  Shortness of breath  EXAM: PORTABLE CHEST - 1 VIEW  COMPARISON:  Prior radiograph from 11/03/2013  FINDINGS: Mild cardiomegaly is stable as compared to prior exam. Atherosclerotic calcifications noted within the aortic arch.  The lungs are normally inflated. No airspace consolidation, pleural effusion, or pulmonary edema is identified. There is no pneumothorax.  No acute osseous abnormality identified.  IMPRESSION: No active cardiopulmonary process.   Electronically Signed   By: Jeannine Boga M.D.   On: 02/26/2014 03:32    Scheduled Meds: . scopolamine  1 patch Transdermal Q72H   Continuous Infusions: . sodium chloride 10 mL/hr at 02/26/14 1010  . morphine 1 mg/hr (02/26/14 1015)   Antibiotics Given (last 72 hours)   Date/Time Action Medication Dose Rate   02/26/14 0821 Given   piperacillin-tazobactam (ZOSYN) IVPB 3.375 g 3.375 g 12.5 mL/hr      Principal Problem:   Respiratory distress Active Problems:   End of life care    Aspiration pneumonia   Palliative care encounter   Dyspnea    Time spent: 35 min    VANN, [facility] Pager [phone]. If 7PM-7AM, please contact night-coverage at [email or web address], password [facility] 2014-03-18, 8:56 AM  LOS: 1 day

## 2014-03-25 NOTE — Clinical Documentation Improvement (Signed)
Presents with Aspiration Pneumonia and respiratory distress; end of life care initiated.   Patient's respiratory rate ranging from 20 - 44  Apnea is documented  Saturations running from 65 - 98%  Please clarify if a more acute condition ruled in or out.   Acute Respiratory Failure Acute Respiratory Insufficiency Other Condition   Thank You, Zoila Shutter ,RN Clinical Documentation Specialist:  [facility]

## 2014-03-25 NOTE — Discharge Summary (Signed)
Physician Discharge Summary  Dale Wong [id number] DOB: 07/14/1927 DOA: 02/26/2014  PCP: Kathlene November, MD  Admit date: 02/26/2014 Discharge date: 03/05/14  Time spent: 35 minutes  Recommendations for Outpatient Follow-up:  1. To be GIP  Discharge Diagnoses:  Principal Problem:   Respiratory distress Active Problems:   End of life care   Aspiration pneumonia   Palliative care encounter   Dyspnea   Tachypnea   Discharge Condition: terminal  Diet recommendation: NPO  Filed Weights   02/26/14 1900  Weight: 67.949 kg (149 lb 12.8 oz)    History of present illness:  Dale Wong is a 80 y.o. male who is brought in from his SNF in respiratory distress. Patient has clearly had an aspiration event and still has food stuff in his mouth. He is unable to provide any history.  [name] is present at bedside who is also patients POA. Per her patient has had progressive decline in the SNF since admission late last year. He had gotten to the point where she states his quality of life was essentially "nothing". This week he had been suffering from zoster on his head   Hospital Course:  Acute Respiratory Failure  due to aspiration PNA - now comfort care  End of life care - morphine gtt ordered to titrate to comfort for air hunger. Ativan, scopolamine patch. palliative care consult   Procedures:  none  Consultations:  palliative  Discharge Exam: Filed Vitals:   2014-03-05 1349  BP: 61/36  Pulse: 110  Temp: 103 F (39.4 C)  Resp: 21      Discharge Instructions     Medication List    ASK your doctor about these medications       acetaminophen 500 MG tablet  Commonly known as:  TYLENOL  Take 1,000 mg by mouth every 8 (eight) hours as needed for pain.     aspirin EC 81 MG tablet  Take 81 mg by mouth at bedtime.     calcium-vitamin D 500-200 MG-UNIT per tablet  Commonly known as:  OSCAL WITH D  Take 1 tablet by mouth daily with breakfast.      carbidopa-levodopa 25-250 MG per disintegrating tablet  Commonly known as:  PARCOPA  Take 1.5 tablets by mouth 4 (four) times daily.     CENTRUM SILVER PO  Take 1 tablet by mouth daily.     citalopram 20 MG tablet  Commonly known as:  CELEXA  Take 20 mg by mouth daily.     cyanocobalamin 500 MCG tablet  Take 500 mcg by mouth every morning.     entacapone 200 MG tablet  Commonly known as:  COMTAN  Take 200 mg by mouth 4 (four) times daily.     EXELON 13.3 MG/24HR Pt24  Generic drug:  Rivastigmine  Place 13.3 mg onto the skin daily.     fish oil-omega-3 fatty acids 1000 MG capsule  Take 1 g by mouth daily.     LORazepam 1 MG tablet  Commonly known as:  ATIVAN  Take 1 tablet (1 mg total) by mouth at bedtime.     NAMENDA XR 28 MG Cp24  Generic drug:  Memantine HCl ER  Take 28 mg by mouth daily.     nystatin-triamcinolone cream  Commonly known as:  MYCOLOG II  Apply 1 application topically 4 (four) times daily as needed.     rOPINIRole 3 MG tablet  Commonly known as:  REQUIP  Take 3 mg by mouth 3 (  three) times daily.     rotigotine 6 MG/24HR  Commonly known as:  NEUPRO  Place 1 patch onto the skin daily.     SEROQUEL 25 MG tablet  Generic drug:  QUEtiapine  Take 25 mg by mouth at bedtime.     simvastatin 20 MG tablet  Commonly known as:  ZOCOR  TAKE 1 TABLET BY MOUTH AT BEDTIME     triamcinolone cream 0.1 %  Commonly known as:  KENALOG  Apply 1 application topically 2 (two) times daily as needed (itching/rash).     vitamin C 500 MG tablet  Commonly known as:  ASCORBIC ACID  Take 500 mg by mouth daily.       No Known Allergies    The results of significant diagnostics from this hospitalization (including imaging, microbiology, ancillary and laboratory) are listed below for reference.    Significant Diagnostic Studies: Dg Chest Port 1 View  02/26/2014   CLINICAL DATA:  Shortness of breath  EXAM: PORTABLE CHEST - 1 VIEW  COMPARISON:  Prior radiograph from  11/03/2013  FINDINGS: Mild cardiomegaly is stable as compared to prior exam. Atherosclerotic calcifications noted within the aortic arch.  The lungs are normally inflated. No airspace consolidation, pleural effusion, or pulmonary edema is identified. There is no pneumothorax.  No acute osseous abnormality identified.  IMPRESSION: No active cardiopulmonary process.   Electronically Signed   By: Jeannine Boga M.D.   On: 02/26/2014 03:32    Microbiology: Recent Results (from the past 240 hour(s))  CULTURE, BLOOD (ROUTINE X 2)     Status: None   Collection Time    02/26/14  3:09 AM      Result Value Ref Range Status   Specimen Description BLOOD RIGHT ARM   Final   Special Requests     Final   Value: BOTTLES DRAWN AEROBIC AND ANAEROBIC 10CC BLUE 5CC RED   Culture  Setup Time     Final   Value: 02/26/2014 08:43     Performed at [facility]   Culture     Final   Value:        BLOOD CULTURE RECEIVED NO GROWTH TO DATE CULTURE WILL BE HELD FOR 5 DAYS BEFORE ISSUING A FINAL NEGATIVE REPORT     Performed at [facility]   Report Status PENDING   Incomplete  CULTURE, BLOOD (ROUTINE X 2)     Status: None   Collection Time    02/26/14  3:16 AM      Result Value Ref Range Status   Specimen Description BLOOD LEFT FOREARM   Final   Special Requests BOTTLES DRAWN AEROBIC ONLY 5CC   Final   Culture  Setup Time     Final   Value: 02/26/2014 08:42     Performed at [facility]   Culture     Final   Value:        BLOOD CULTURE RECEIVED NO GROWTH TO DATE CULTURE WILL BE HELD FOR 5 DAYS BEFORE ISSUING A FINAL NEGATIVE REPORT     Performed at [facility]   Report Status PENDING   Incomplete     Labs: Basic Metabolic Panel:  Recent Labs Lab 02/26/14 0237  NA 169*  K 4.2  CL 126*  CO2 28  GLUCOSE 93  BUN 90*  CREATININE 1.63*  CALCIUM 10.1   Liver Function Tests:  Recent Labs Lab 02/26/14 0237  AST 61*  ALT 10  ALKPHOS 76  BILITOT 0.8  PROT 7.8   ALBUMIN 3.4*   No results found for this basename: LIPASE, AMYLASE,  in the last 168 hours No results found for this basename: AMMONIA,  in the last 168 hours CBC:  Recent Labs Lab 02/26/14 0237  WBC 7.0  NEUTROABS 5.4  HGB 15.8  HCT 48.5  MCV 94.4  PLT 181   Cardiac Enzymes:  Recent Labs Lab 02/26/14 0237  TROPONINI <0.30   BNP: BNP (last 3 results)  Recent Labs  02/26/14 0237  PROBNP 667.2*   CBG: No results found for this basename: GLUCAP,  in the last 168 hours     Signed:  Eliseo Squires, JESSICA  [facility] 2014/02/28, 2:00 PM

## 2014-03-25 NOTE — Progress Notes (Signed)
[facility] Social Work Note: [facility] received request to evaluate Mr. Eudy for admission to hospice in the hospital. Per [facility] RN patient approved with Parkinson's Disease (332.0). Have attempted to contact [facility]/friend to complete admission paper work but no answer. Await [facility] arrival. Thank you. Erling Conte [phone]

## 2014-03-25 DEATH — deceased

## 2014-10-09 IMAGING — CR DG HAND COMPLETE 3+V*L*
3 series · 3 of 3 positions shown · non-contrast
Comparison: None.

CLINICAL DATA: Pain

LEFT HAND - COMPLETE 3+ VIEW

[x hand pa left]
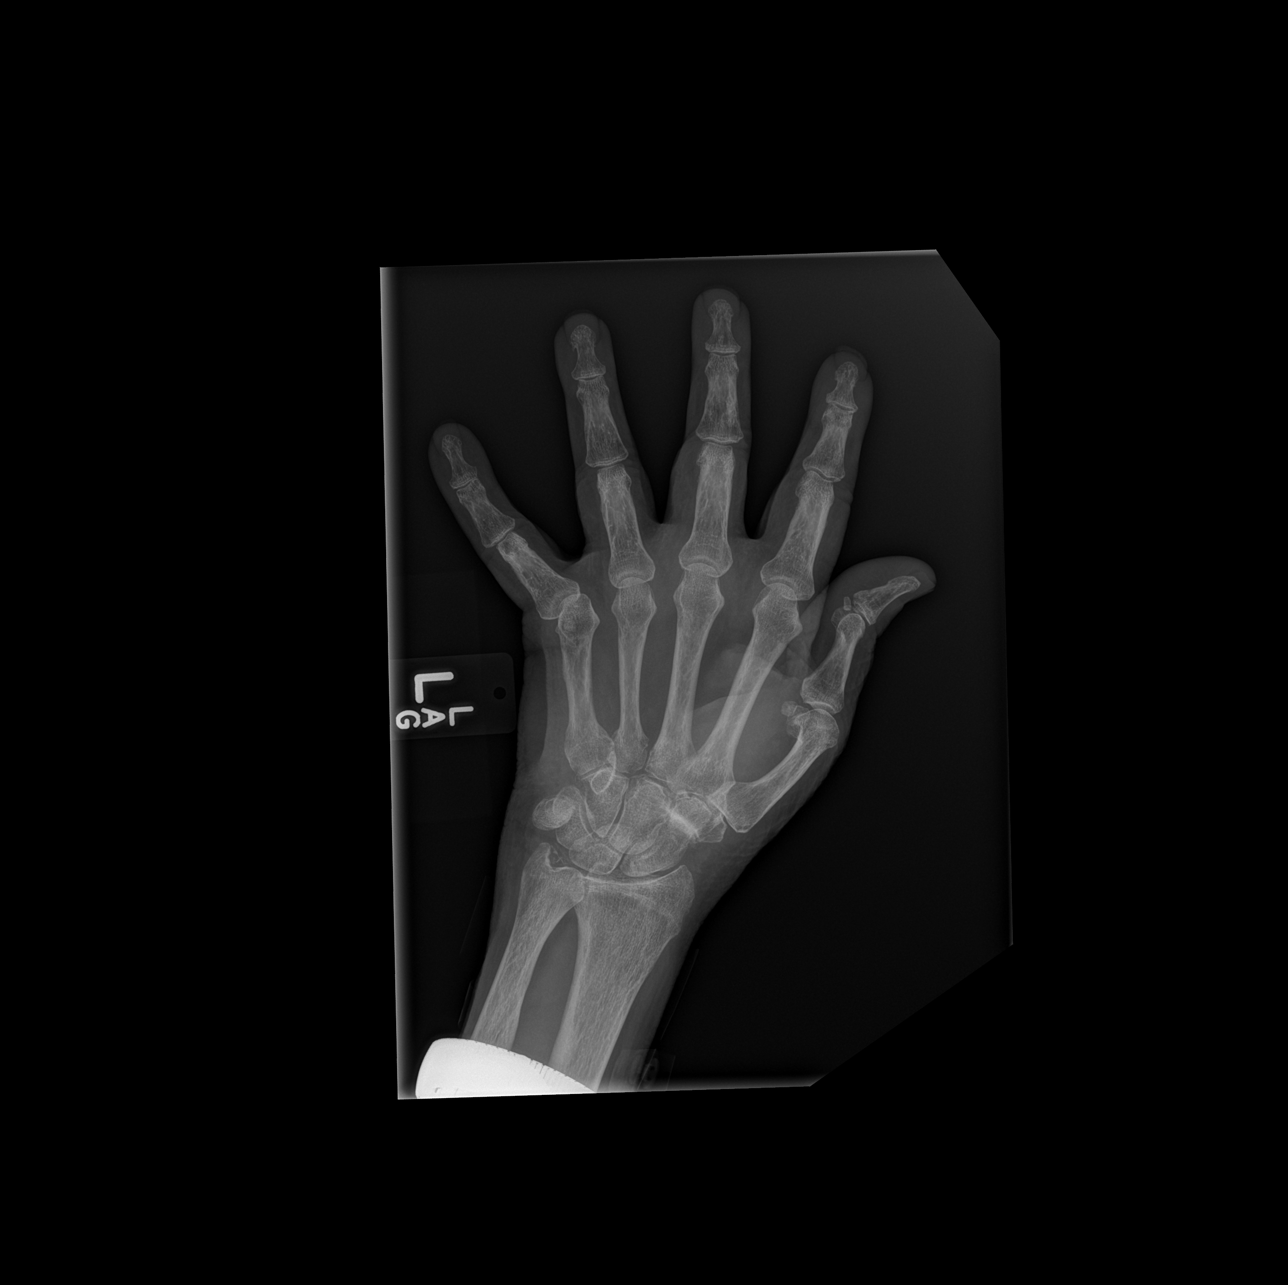

[x hand obl left]
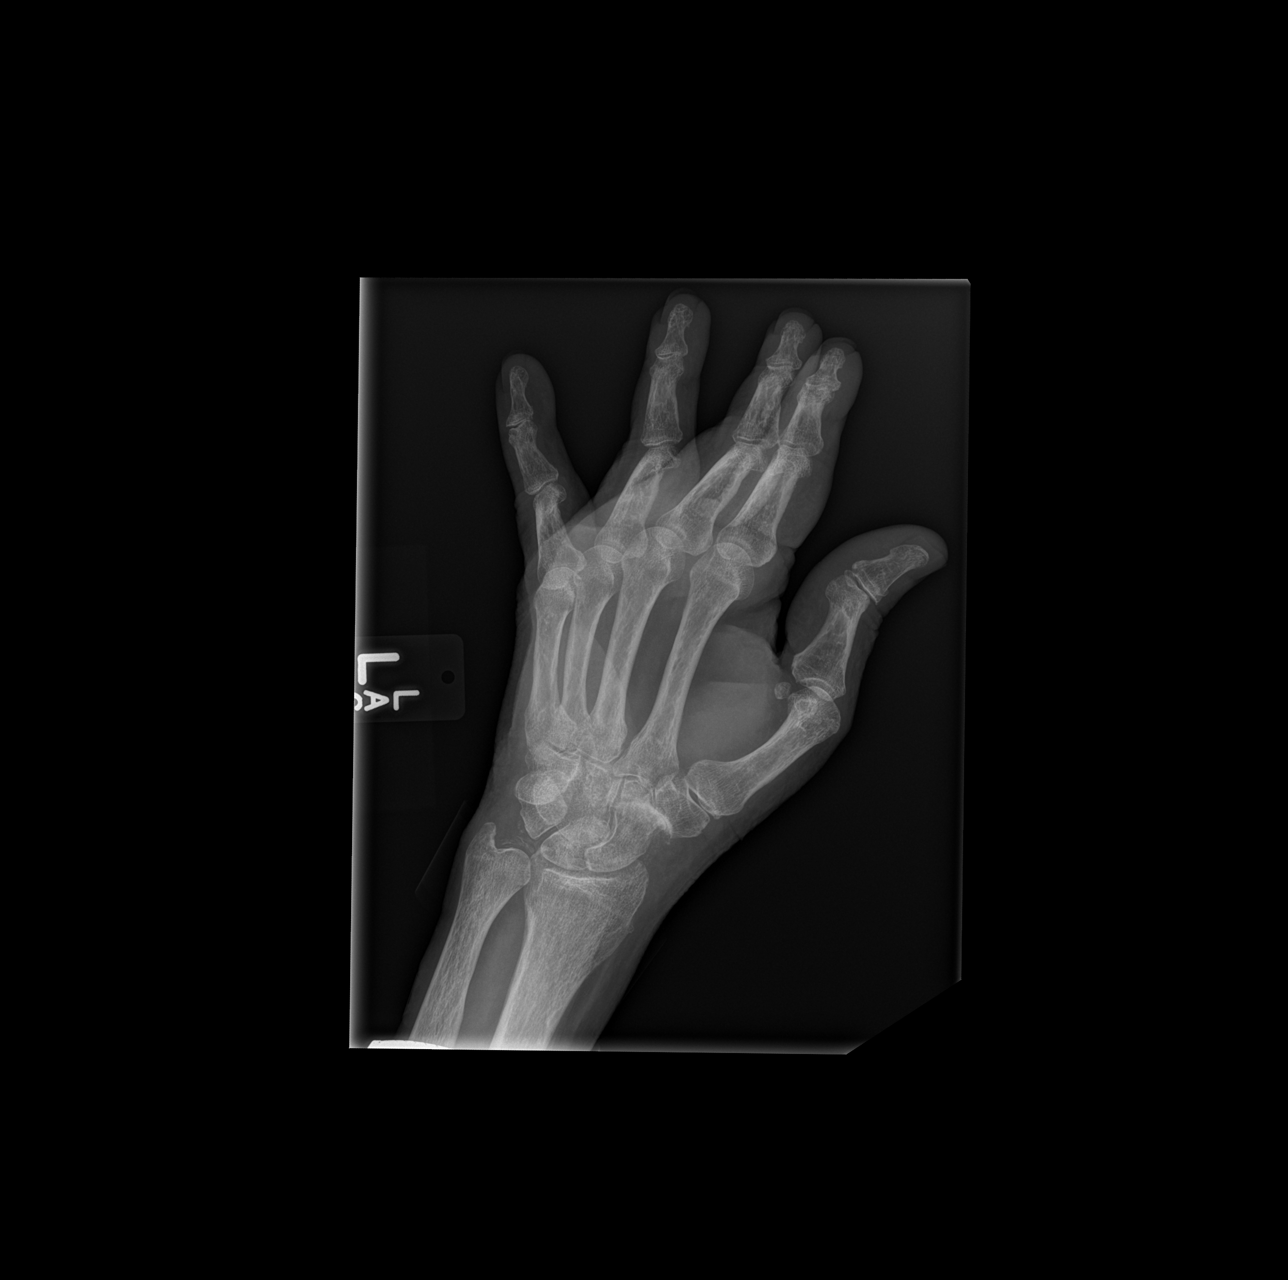

[x hand lat left]
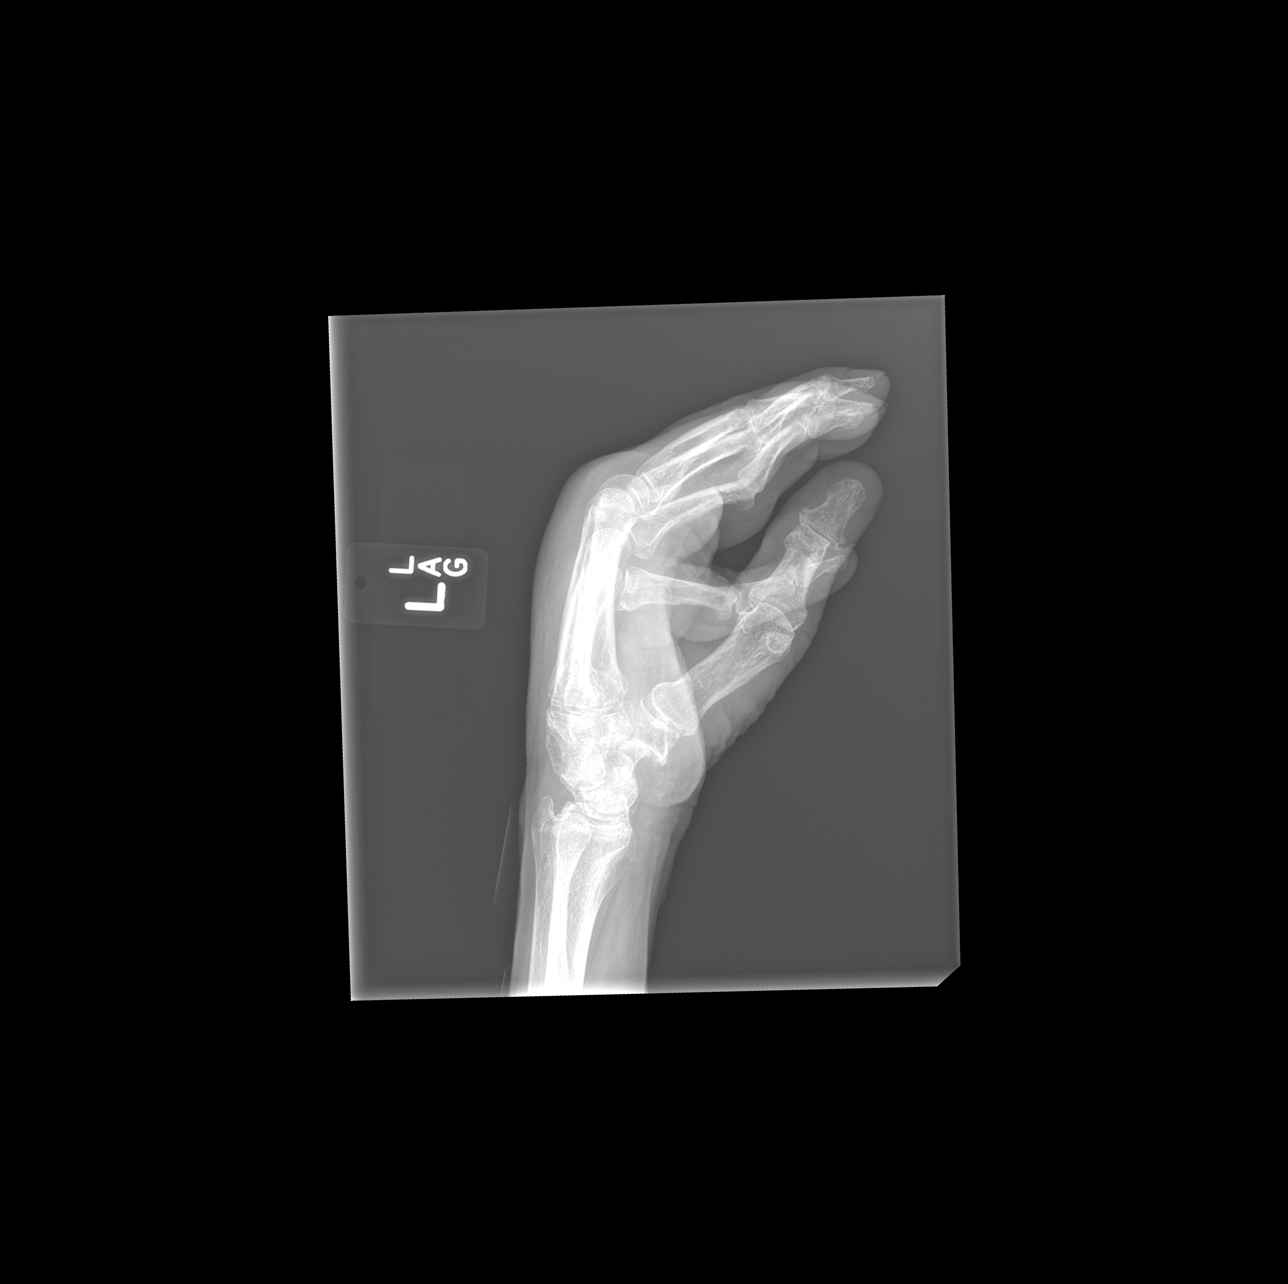

[3 of 3 positions shown; findings below may reference images not displayed]

FINDINGS: Diffuse osteopenia.  There are degenerative changes of
the STT and first CMC joints.  Mild ulnar subluxation at the fifth
metacarpal phalangeal joint may be positioning or secondary to
injury. There is dorsal tilt of the lunate on the lateral view.  No
acute displaced fracture identified.
IMPRESSION: Diffuse osteopenia. No displaced fracture identified.If clinical
concern for a fracture persists, recommend a repeat radiograph in 5-
10 days to evaluate for interval change or callus formation.

Mild ulnar subluxation at the fifth metacarpal phalangeal joint may
be positioning or secondary to injury.  Correlate with range of
motion.

First carpal metacarpal and STT joint degenerative changes.

Dorsal tilt of the lunate may reflect carpal instability.

## 2014-10-09 IMAGING — CR DG FOOT COMPLETE 3+V*R*
3 series · 3 of 3 positions shown · non-contrast
Comparison: None

CLINICAL DATA: Fall, nail injury fifth toe, bruising across first
through fifth metatarsals

RIGHT FOOT COMPLETE - 3+ VIEW

[x foot ap right]
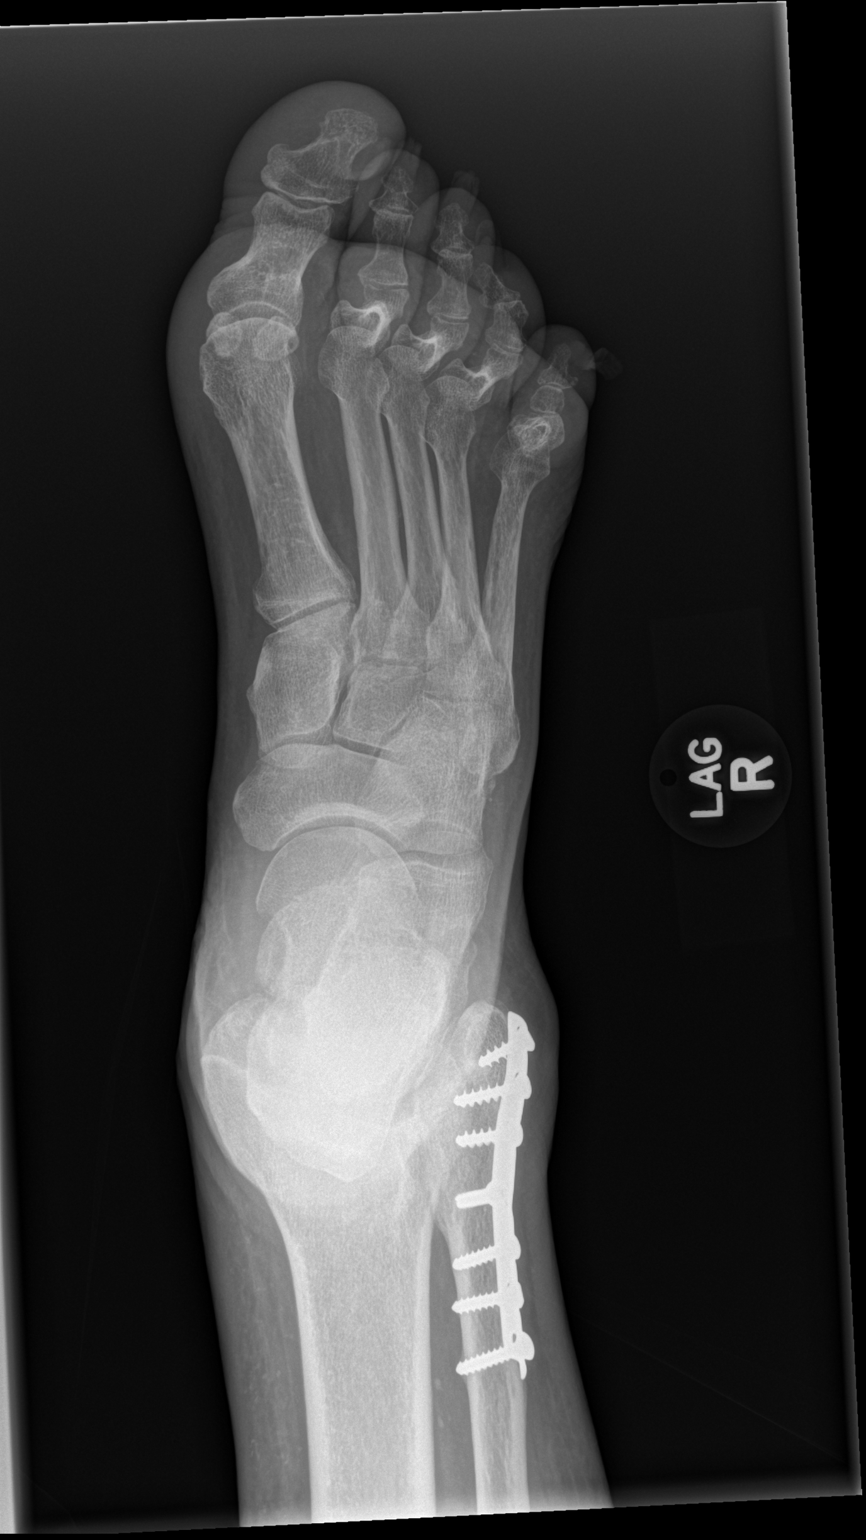

[x foot obl right]
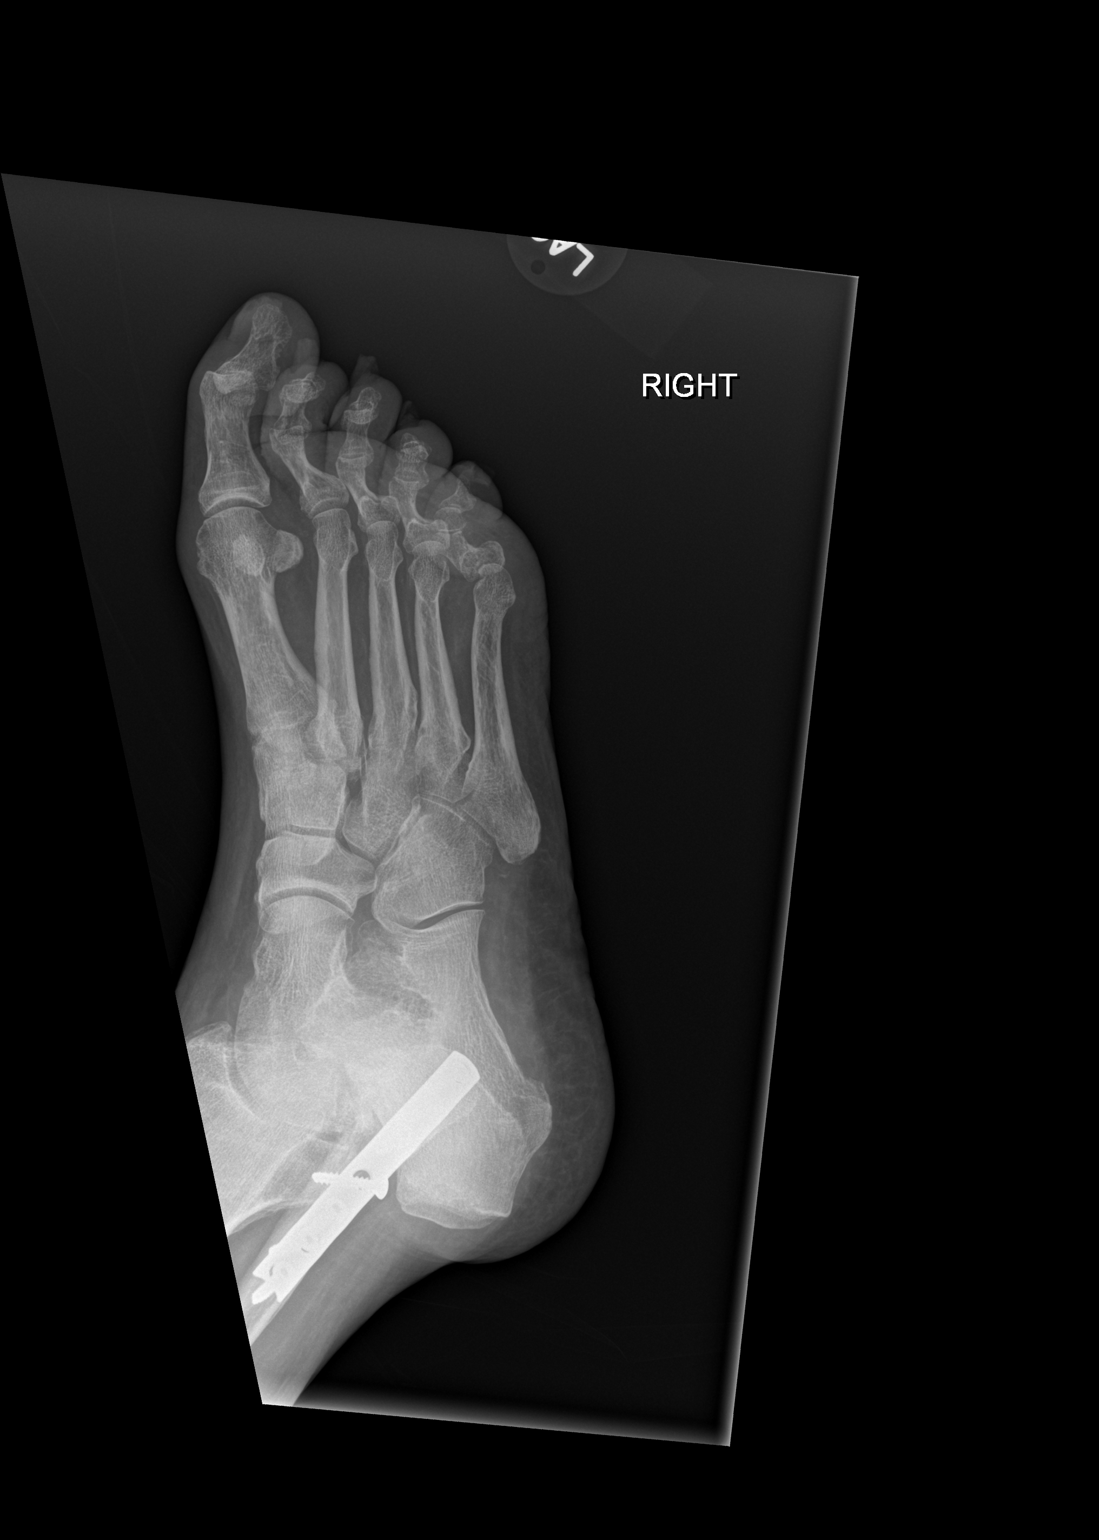

[x foot lat right]
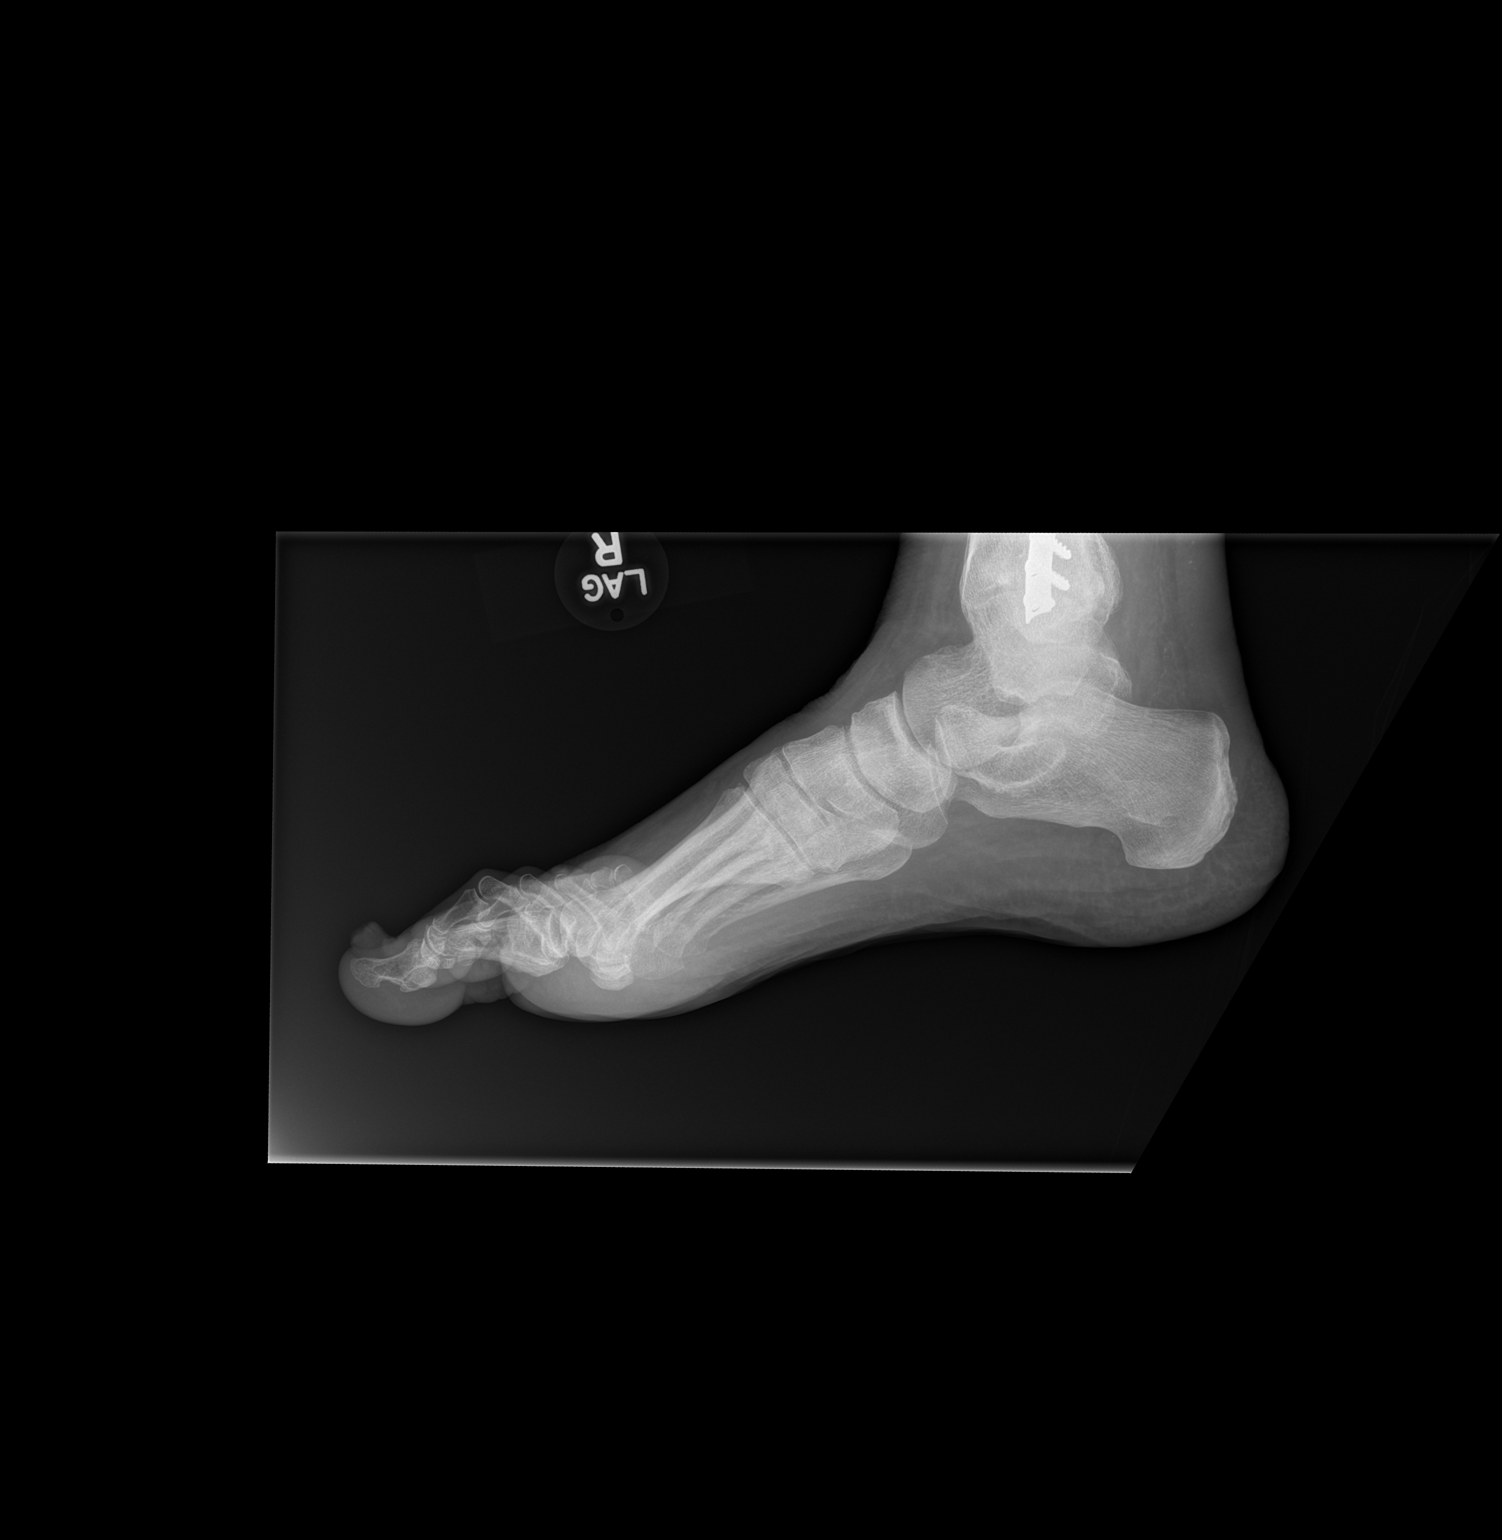

[3 of 3 positions shown; findings below may reference images not displayed]

FINDINGS: Lateral plate and multiple screws identified at distal fibula.
Bones appear diffusely demineralized.
Joint spaces preserved.
No acute fracture or dislocation.
Lucency is identified at the distal aspect of the calcaneus on the
oblique view, not identified on remaining two views.
IMPRESSION: Osseous demineralization.
Prior ORIF distal fibula.
Questionable lucency at the distal right calcaneus on oblique view,
not seen on remaining views.
Significance is uncertain but a destructive process of the distal
calcaneus is not entirely excluded; consider follow-up CT or MR
imaging to evaluate.
